# Patient Record
Sex: Female | Born: 1935 | Race: White | Hispanic: No | Smoking: Never smoker
Health system: Southern US, Community
[De-identification: ages and names within clinical notes are randomized; demographics above are authoritative.]

## PROBLEM LIST (undated history)

## (undated) DIAGNOSIS — R0789 Other chest pain: Secondary | ICD-10-CM

## (undated) DIAGNOSIS — I10 Essential (primary) hypertension: Secondary | ICD-10-CM

## (undated) DIAGNOSIS — I472 Ventricular tachycardia, unspecified: Secondary | ICD-10-CM

## (undated) DIAGNOSIS — H269 Unspecified cataract: Secondary | ICD-10-CM

## (undated) DIAGNOSIS — I480 Paroxysmal atrial fibrillation: Secondary | ICD-10-CM

## (undated) DIAGNOSIS — M1990 Unspecified osteoarthritis, unspecified site: Secondary | ICD-10-CM

## (undated) DIAGNOSIS — H3530 Unspecified macular degeneration: Secondary | ICD-10-CM

## (undated) DIAGNOSIS — A159 Respiratory tuberculosis unspecified: Secondary | ICD-10-CM

## (undated) DIAGNOSIS — R300 Dysuria: Secondary | ICD-10-CM

## (undated) DIAGNOSIS — Z7901 Long term (current) use of anticoagulants: Secondary | ICD-10-CM

## (undated) DIAGNOSIS — K219 Gastro-esophageal reflux disease without esophagitis: Secondary | ICD-10-CM

## (undated) DIAGNOSIS — S32010A Wedge compression fracture of first lumbar vertebra, initial encounter for closed fracture: Secondary | ICD-10-CM

## (undated) HISTORY — DX: Unspecified osteoarthritis, unspecified site: M19.90

## (undated) HISTORY — DX: Ventricular tachycardia: I47.2

## (undated) HISTORY — DX: Unspecified macular degeneration: H35.30

## (undated) HISTORY — PX: APPENDECTOMY: SHX54

## (undated) HISTORY — DX: Respiratory tuberculosis unspecified: A15.9

## (undated) HISTORY — DX: Wedge compression fracture of first lumbar vertebra, initial encounter for closed fracture: S32.010A

## (undated) HISTORY — DX: Paroxysmal atrial fibrillation: I48.0

## (undated) HISTORY — DX: Other chest pain: R07.89

## (undated) HISTORY — PX: ABDOMINAL HYSTERECTOMY: SHX81

## (undated) HISTORY — DX: Ventricular tachycardia, unspecified: I47.20

## (undated) HISTORY — DX: Gastro-esophageal reflux disease without esophagitis: K21.9

## (undated) HISTORY — DX: Dysuria: R30.0

## (undated) HISTORY — DX: Long term (current) use of anticoagulants: Z79.01

## (undated) HISTORY — PX: BREAST BIOPSY: SHX20

## (undated) HISTORY — DX: Essential (primary) hypertension: I10

## (undated) HISTORY — DX: Unspecified cataract: H26.9

---

## 1998-08-04 ENCOUNTER — Ambulatory Visit (HOSPITAL_COMMUNITY): Admission: RE | Admit: 1998-08-04 | Discharge: 1998-08-04 | Payer: Self-pay | Admitting: Specialist

## 1999-05-10 ENCOUNTER — Encounter: Payer: Self-pay | Admitting: Cardiology

## 1999-05-10 ENCOUNTER — Encounter: Payer: Self-pay | Admitting: Emergency Medicine

## 1999-05-10 ENCOUNTER — Inpatient Hospital Stay (HOSPITAL_COMMUNITY): Admission: EM | Admit: 1999-05-10 | Discharge: 1999-05-14 | Payer: Self-pay | Admitting: Emergency Medicine

## 1999-05-11 ENCOUNTER — Encounter: Payer: Self-pay | Admitting: Cardiovascular Disease

## 1999-06-29 ENCOUNTER — Ambulatory Visit (HOSPITAL_COMMUNITY): Admission: RE | Admit: 1999-06-29 | Discharge: 1999-06-29 | Payer: Self-pay | Admitting: Specialist

## 1999-12-19 ENCOUNTER — Inpatient Hospital Stay (HOSPITAL_COMMUNITY): Admission: EM | Admit: 1999-12-19 | Discharge: 1999-12-20 | Payer: Self-pay | Admitting: *Deleted

## 1999-12-21 ENCOUNTER — Inpatient Hospital Stay (HOSPITAL_COMMUNITY): Admission: EM | Admit: 1999-12-21 | Discharge: 1999-12-29 | Payer: Self-pay | Admitting: Emergency Medicine

## 1999-12-21 ENCOUNTER — Encounter: Payer: Self-pay | Admitting: Emergency Medicine

## 1999-12-22 ENCOUNTER — Encounter: Payer: Self-pay | Admitting: Gastroenterology

## 1999-12-24 ENCOUNTER — Encounter: Payer: Self-pay | Admitting: Internal Medicine

## 2000-01-12 ENCOUNTER — Encounter: Admission: RE | Admit: 2000-01-12 | Discharge: 2000-01-12 | Payer: Self-pay | Admitting: Surgery

## 2000-01-12 ENCOUNTER — Encounter: Payer: Self-pay | Admitting: Surgery

## 2001-03-07 ENCOUNTER — Encounter: Admission: RE | Admit: 2001-03-07 | Discharge: 2001-03-07 | Payer: Self-pay | Admitting: Surgery

## 2001-03-07 ENCOUNTER — Encounter: Payer: Self-pay | Admitting: Surgery

## 2002-02-12 ENCOUNTER — Observation Stay (HOSPITAL_COMMUNITY): Admission: EM | Admit: 2002-02-12 | Discharge: 2002-02-13 | Payer: Self-pay | Admitting: Emergency Medicine

## 2002-02-12 ENCOUNTER — Encounter: Payer: Self-pay | Admitting: Emergency Medicine

## 2002-02-13 ENCOUNTER — Encounter: Payer: Self-pay | Admitting: Cardiovascular Disease

## 2002-03-22 ENCOUNTER — Encounter: Admission: RE | Admit: 2002-03-22 | Discharge: 2002-03-22 | Payer: Self-pay | Admitting: Surgery

## 2002-03-22 ENCOUNTER — Encounter: Payer: Self-pay | Admitting: Surgery

## 2003-08-02 ENCOUNTER — Emergency Department (HOSPITAL_COMMUNITY): Admission: EM | Admit: 2003-08-02 | Discharge: 2003-08-03 | Payer: Self-pay | Admitting: Emergency Medicine

## 2003-08-05 ENCOUNTER — Observation Stay (HOSPITAL_COMMUNITY): Admission: EM | Admit: 2003-08-05 | Discharge: 2003-08-15 | Payer: Self-pay

## 2003-10-16 ENCOUNTER — Ambulatory Visit (HOSPITAL_COMMUNITY): Admission: RE | Admit: 2003-10-16 | Discharge: 2003-10-17 | Payer: Self-pay | Admitting: Orthopaedic Surgery

## 2006-05-31 ENCOUNTER — Encounter: Admission: RE | Admit: 2006-05-31 | Discharge: 2006-05-31 | Payer: Self-pay | Admitting: Orthopedic Surgery

## 2006-06-01 ENCOUNTER — Ambulatory Visit (HOSPITAL_BASED_OUTPATIENT_CLINIC_OR_DEPARTMENT_OTHER): Admission: RE | Admit: 2006-06-01 | Discharge: 2006-06-01 | Payer: Self-pay | Admitting: Orthopedic Surgery

## 2006-07-14 ENCOUNTER — Ambulatory Visit (HOSPITAL_COMMUNITY): Admission: RE | Admit: 2006-07-14 | Discharge: 2006-07-14 | Payer: Self-pay | Admitting: Ophthalmology

## 2010-08-21 ENCOUNTER — Emergency Department (HOSPITAL_COMMUNITY): Payer: Medicare Other

## 2010-08-21 ENCOUNTER — Inpatient Hospital Stay (HOSPITAL_COMMUNITY)
Admission: EM | Admit: 2010-08-21 | Discharge: 2010-08-25 | DRG: 309 | Disposition: A | Discharge status: Home / Self Care | Payer: Medicare Other | Attending: Cardiovascular Disease | Admitting: Cardiovascular Disease

## 2010-08-21 DIAGNOSIS — K219 Gastro-esophageal reflux disease without esophagitis: Secondary | ICD-10-CM | POA: Diagnosis present

## 2010-08-21 DIAGNOSIS — I4891 Unspecified atrial fibrillation: Principal | ICD-10-CM | POA: Diagnosis present

## 2010-08-21 DIAGNOSIS — R109 Unspecified abdominal pain: Secondary | ICD-10-CM

## 2010-08-21 DIAGNOSIS — H3530 Unspecified macular degeneration: Secondary | ICD-10-CM | POA: Diagnosis present

## 2010-08-21 DIAGNOSIS — I509 Heart failure, unspecified: Secondary | ICD-10-CM | POA: Diagnosis present

## 2010-08-21 DIAGNOSIS — M1990 Unspecified osteoarthritis, unspecified site: Secondary | ICD-10-CM | POA: Diagnosis present

## 2010-08-21 DIAGNOSIS — E860 Dehydration: Secondary | ICD-10-CM | POA: Diagnosis present

## 2010-08-21 DIAGNOSIS — R002 Palpitations: Secondary | ICD-10-CM

## 2010-08-21 DIAGNOSIS — I10 Essential (primary) hypertension: Secondary | ICD-10-CM | POA: Diagnosis present

## 2010-08-21 DIAGNOSIS — Z7982 Long term (current) use of aspirin: Secondary | ICD-10-CM

## 2010-08-21 DIAGNOSIS — Z8611 Personal history of tuberculosis: Secondary | ICD-10-CM

## 2010-08-21 DIAGNOSIS — Z79899 Other long term (current) drug therapy: Secondary | ICD-10-CM

## 2010-08-21 DIAGNOSIS — I5030 Unspecified diastolic (congestive) heart failure: Secondary | ICD-10-CM | POA: Diagnosis present

## 2010-08-21 HISTORY — PX: OTHER SURGICAL HISTORY: SHX169

## 2010-08-21 LAB — CBC
HCT: 34.2 % — ABNORMAL LOW (ref 36.0–46.0)
MCHC: 32.2 g/dL (ref 30.0–36.0)
Platelets: 273 10*3/uL (ref 150–400)
RBC: 4 MIL/uL (ref 3.87–5.11)
RDW: 14.6 % (ref 11.5–15.5)

## 2010-08-21 LAB — POCT CARDIAC MARKERS
CKMB, poc: 2.8 ng/mL (ref 1.0–8.0)
Troponin i, poc: <0.05 ng/mL (ref 0.00–0.09)

## 2010-08-21 LAB — DIFFERENTIAL
Eosinophils Absolute: 0.1 10*3/uL (ref 0.0–0.7)
Lymphocytes Relative: 21 % (ref 12–46)
Neutro Abs: 5.4 10*3/uL (ref 1.7–7.7)
Neutrophils Relative %: 67 % (ref 43–77)

## 2010-08-21 LAB — COMPREHENSIVE METABOLIC PANEL
ALT: 15 U/L (ref 0–35)
Alkaline Phosphatase: 90 U/L (ref 39–117)
CO2: 22 mEq/L (ref 19–32)
Calcium: 9.1 mg/dL (ref 8.4–10.5)
Chloride: 98 mEq/L (ref 96–112)
GFR calc Af Amer: >60 mL/min (ref >60)
Glucose, Bld: 125 mg/dL — ABNORMAL HIGH (ref 70–99)
Potassium: 3.4 mEq/L — ABNORMAL LOW (ref 3.5–5.1)

## 2010-08-21 LAB — PROTIME-INR: INR: 0.91 (ref 0.00–1.49)

## 2010-08-21 LAB — PRO B NATRIURETIC PEPTIDE: Pro B Natriuretic peptide (BNP): 308.6 pg/mL (ref 0–450)

## 2010-08-21 LAB — MAGNESIUM: Magnesium: 2.2 mg/dL (ref 1.5–2.5)

## 2010-08-21 LAB — TROPONIN I: Troponin I: 0.36 ng/mL (ref <0.30)

## 2010-08-22 DIAGNOSIS — I4891 Unspecified atrial fibrillation: Secondary | ICD-10-CM

## 2010-08-22 LAB — CARDIAC PANEL(CRET KIN+CKTOT+MB+TROPI)
CK, MB: 5.7 ng/mL — ABNORMAL HIGH (ref 0.3–4.0)
CK, MB: 4.6 ng/mL — ABNORMAL HIGH (ref 0.3–4.0)
Relative Index: INVALID (ref 0.0–2.5)
Total CK: 100 U/L (ref 7–177)
Troponin I: 0.5 ng/mL (ref <0.30)
Troponin I: <0.3 ng/mL (ref <0.30)

## 2010-08-22 LAB — URINE MICROSCOPIC-ADD ON

## 2010-08-22 LAB — URINALYSIS, ROUTINE W REFLEX MICROSCOPIC
Bilirubin Urine: NEGATIVE
Glucose, UA: NEGATIVE mg/dL
Hgb urine dipstick: NEGATIVE
Ketones, ur: NEGATIVE mg/dL
Nitrite: NEGATIVE
Protein, ur: NEGATIVE mg/dL
Specific Gravity, Urine: 1.01 (ref 1.005–1.030)
Urobilinogen, UA: 0.2 mg/dL (ref 0.0–1.0)
pH: 6 (ref 5.0–8.0)

## 2010-08-22 LAB — HEPARIN LEVEL (UNFRACTIONATED): Heparin Unfractionated: 0.18 IU/mL — ABNORMAL LOW (ref 0.30–0.70)

## 2010-08-22 NOTE — H&P (Signed)
NAME:  Kristy Valencia, KRUSCHKE NO.:  [id number]  MEDICAL RECORD NO.:  [id number]           PATIENT TYPE:  E  LOCATION:  [facility]                         FACILITY:  [facility]  PHYSICIAN:  Zacarias Pontes, MD       DATE OF BIRTH:  13-Oct-1919  DATE OF ADMISSION:  08/21/2010 DATE OF DISCHARGE:                             HISTORY & PHYSICAL   PRIMARY CARDIOLOGIST:  Vesta Mixer, M.D.  LOCATION:  Assessed in emergency room.  CHIEF COMPLAINT:  Palpitations and lower abdominal pain.  HISTORY OF PRESENT ILLNESS:  [name] is a pleasant 77 year old woman with a remote history of paroxysmal atrial fibrillation who presents with lower abdominal pain and urinary urgency.  In my interview of the patient who is currently unaccompanied by any family, she says her main complaint from earlier today is lower abdominal pain, dysuria, and urinary urgency.  She also reports having had difficulty voiding over the course of today.  She currently denies chest pain or palpitations, though her ability to reliably give accurate history appears to be somewhat limited.  By report, given the emergency room staffs' discussions with the patient's family, it seems that she reportedly experienced chest tightness and palpitations earlier in the day prompting a decision to seek medical care and further evaluation.  Of note, the patient denies fevers, nausea, or diarrhea.  She reports that her appetite has been somewhat diminished over the course of the past several days.  PAST MEDICAL HISTORY: 1. Paroxysmal atrial fibrillation in the remote past requiring     cardioversion.  It seems she was previously on systemic     anticoagulation in the form of Coumadin, though not currently. 2. Remote TB. 3. Osteoarthritis. 4. GERD. 5. Hypertension. 6. L1 compression fracture. 7. Macular degeneration.  SOCIAL HISTORY:  She is widowed and retired and reportedly lives with family though is currently unaccompanied  by any family.  She is a nonsmoker.  FAMILY HISTORY:  Noncontributory to this presentation.  REVIEW OF SYSTEMS:  As per HPI, otherwise is comprehensively negative.  ALLERGIES:  PENICILLIN, CODEINE, SULFA, and PROCARDIA.  MEDICATIONS:  By report, the patient takes aspirin and Lasix on a daily basis.  When asked directly, she is unable to recall what medicines she takes.  PHYSICAL EXAMINATION:  Comprehensive cardiovascular exam was performed. VITAL SIGNS:  On my assessment, the patient's heart rate is in the 150s to 160s and irregular, though narrow complex.  Her blood pressure is 120/70.  She is breathing comfortably at 16 times per minute and satting 98% on 2 liters of nasal cannula. GENERAL:  She is in no acute distress. HEENT:  Notable for a supple neck with no masses or lymphadenopathy. Her JVP is not appreciably elevated. CARDIAC:  Notable for an irregular rapid heart rate with no murmurs appreciated. LUNGS:  Clear to auscultation bilaterally with no wheezing or crackles appreciated. ABDOMEN:  Soft and nondistended.  She has lower abdominal tenderness in the suprapubic region.  No rebound or guarding is present. EXTREMITIES:  Warm and well perfused with 1+ lower extremity pitting edema bilaterally. NEUROLOGIC:  She is alert and oriented to  person and place.  She has difficulty recalling the details earlier today and provides various versions of who it is that she actually lives with.  LABORATORY EVALUATION:  Her basic metabolic panel is notable for a sodium of 132, potassium 3.4, bicarb 22, BUN 10, creatinine 0.8, glucose 125.  Her white count is 4, her hematocrit is 34, her platelets are 273,000.  Her extended panel is unremarkable.  Her pro-BNP is in the normal range at 308.  Her first set of troponins were negative.  Her INR is 0.9 reflecting the absence of systemic anticoagulation.  Her ECG on presentation notable for a narrow complex tachycardia that is  irregular consistent with AFib with a rapid ventricular response with rates in the 170s to 180s on presentation.  A cardiac catheterization in 2001 was reportedly negative.  A nuclear perfusion stress test in 2003 was negative in our system.  IMPRESSION:  This is an 77 year old woman presenting with atrial fibrillation with a rapid ventricular response in the setting of a possible urinary tract infection and lower abdominal discomfort together with mild dehydration.  The keys to her AFib treatment will lie in addressing any underlying or reversible triggers for her rapid ventricular rate.  Based on her current complaints and her exam, a diagnostic focus is in the direction of potential urinary tract infection.  PLANS:  We will admit her for rate control with intravenous diltiazem infusion for now, but I will work to reverse underlying causes that potentially are driving her RVR.  We will send a urinalysis, urine culture, as well as place a Foley catheter to decompress her bladder given her historical symptoms consistent with potentially urinary retention.  Given what appears to be her relative dehydration, we will initiate IV fluids gently in the form of bolus followed by maintenance fluids.  She does not appear toxic nor does she have leukocytosis, nor does she have a fever, so I will dose her antibiotics only if her urinalysis is preliminarily suggestive.  Should her heart rate remain elevated despite fluids and intravenous diltiazem as well as potentially administering antibiotic should her urinalysis prove suggestive, then intravenous amiodarone might provide help in addressing her RPR.  While she is in atrial fibrillation, we will initiate intravenous heparin for thromboembolic protection, though I do not feel that she needs a heparin bolus.  An echocardiogram in the morning will help confirm that she does not have underlying structural heart disease.  There is nothing on her  exam or on her history that would suggest to me that she has structural heart disease.  It will be very helpful to corroborate the patient's history with available family in the morning.          ______________________________ Zacarias Pontes, MD     DM/MEDQ  D:  08/21/2010  T:  08/22/2010  Job:  [id number]  Electronically Signed by Zacarias Pontes MD on 08/22/2010 07:19:32 AM

## 2010-08-23 LAB — CBC
HCT: 32.5 % — ABNORMAL LOW (ref 36.0–46.0)
MCH: 27.6 pg (ref 26.0–34.0)
MCHC: 32 g/dL (ref 30.0–36.0)
MCV: 86.2 fL (ref 78.0–100.0)
Platelets: 256 10*3/uL (ref 150–400)
RDW: 14.8 % (ref 11.5–15.5)
WBC: 6.7 10*3/uL (ref 4.0–10.5)

## 2010-08-23 LAB — URINE CULTURE: Culture  Setup Time: 201205190622

## 2010-08-23 LAB — BASIC METABOLIC PANEL
BUN: 9 mg/dL (ref 6–23)
CO2: 24 mEq/L (ref 19–32)
Calcium: 9 mg/dL (ref 8.4–10.5)
GFR calc non Af Amer: >60 mL/min (ref >60)
Glucose, Bld: 90 mg/dL (ref 70–99)
Sodium: 138 mEq/L (ref 135–145)

## 2010-08-24 DIAGNOSIS — I369 Nonrheumatic tricuspid valve disorder, unspecified: Secondary | ICD-10-CM

## 2010-08-24 HISTORY — PX: TRANSTHORACIC ECHOCARDIOGRAM: SHX275

## 2010-08-24 LAB — URINALYSIS, ROUTINE W REFLEX MICROSCOPIC
Bilirubin Urine: NEGATIVE
Ketones, ur: NEGATIVE mg/dL
Nitrite: NEGATIVE
Specific Gravity, Urine: 1.004 — ABNORMAL LOW (ref 1.005–1.030)
Urobilinogen, UA: 0.2 mg/dL (ref 0.0–1.0)

## 2010-08-24 LAB — GLUCOSE, CAPILLARY
Glucose-Capillary: 93 mg/dL (ref 70–99)
Glucose-Capillary: 105 mg/dL — ABNORMAL HIGH (ref 70–99)

## 2010-08-24 LAB — CBC
HCT: 32.9 % — ABNORMAL LOW (ref 36.0–46.0)
Platelets: 276 10*3/uL (ref 150–400)
RBC: 3.84 MIL/uL — ABNORMAL LOW (ref 3.87–5.11)
RDW: 14.9 % (ref 11.5–15.5)
WBC: 7.4 10*3/uL (ref 4.0–10.5)

## 2010-08-25 LAB — CBC
MCH: 27.8 pg (ref 26.0–34.0)
MCHC: 32.3 g/dL (ref 30.0–36.0)
Platelets: 290 10*3/uL (ref 150–400)

## 2010-08-26 ENCOUNTER — Telehealth: Payer: Self-pay | Admitting: *Deleted

## 2010-08-26 NOTE — Telephone Encounter (Signed)
I received a request from Christy,pharmacist at [facility] [address] to obtain prior authorization for Xarelto 10mg  two tablets daily. According to [facility] the prescription was written 08/25/10  by Leonette Monarch with Dr Alford Highland name circled on the prescription.   The patient has an appointment to see Norma Fredrickson, nurse practitioner for Dr Melburn Popper June 11,2012. The discharge summary dated 08/25/10  states that after discussion with the patient's daughter and Dr Melburn Popper it was decided the patient would be started on Xarelto. The discharge summary also states this  was initiated on May 21,2012, the patient tolerated this well and this will be continued at discharge.  The discharge summary dated 08/25/10 also states on the day of discharge Dr Melburn Popper evaluated the patient and noted her stable for home.   I was told by Neysa Bonito at [facility] that [facility] at  Dr Sallee Provencal office would not obtain prior authorization for Xarelto. Neysa Bonito also told me that the patient was due to take Xarelto now and did not have any  medication because authorization had not been obtained.  I obtained  authorization for Xarelto through [facility] at [employer] [phone] reference # [id number].

## 2010-09-04 NOTE — Discharge Summary (Signed)
NAME:  Kristy Valencia, Kristy Valencia NO.:  [id number]  MEDICAL RECORD NO.:  [id number]           PATIENT TYPE:  I  LOCATION:  3707                         FACILITY:  [facility]  PHYSICIAN:  Vesta Mixer, M.D. DATE OF BIRTH:  July 18, 1919  DATE OF ADMISSION:  08/21/2010 DATE OF DISCHARGE:  08/25/2010                              DISCHARGE SUMMARY   PRIMARY CARDIOLOGIST:  Vesta Mixer, MD  DISCHARGE DIAGNOSIS: 1. Paroxysmal atrial fibrillation, Xarelto started this admission.     a.     Currently in normal sinus rhythm.  SECONDARY DIAGNOSES: 1. Hypertension. 2. Osteoarthritis. 3. Gastroesophageal reflux disease. 4. Remote TB. 5. Osteoarthritis. 6. Macular degeneration.  PROCEDURE//DIAGNOSTICS PERFORMED DURING HOSPITALIZATION: 1. Echocardiogram on Aug 24, 2010:  Left ventricle with wall thickness     demonstrating moderate LVH.  Systolic function was decreased with     an estimated ejection fraction of 65-70%.  Pulmonary artery peak     pressure of 34 mmHg. 2. Chest x-ray on Aug 21, 2010:  Cardiomegaly with mild edema.  ALLERGIES: 1. PENICILLIN. 2. CODEINE. 3. SULFA. 4. PROCARDIA.  REASON FOR HOSPITALIZATION:  This is an 77 year old female with the above-stated problem list.  The patient was brought to the emergency department by her family with reports of experiencing chest tightness and palpitations.  During evaluation, the patient denied any palpitations, but with lower abdominal pain.  The patient's initial EKG showed atrial fibrillation with rapid ventricular response at a rate of 170-180 beats per minute.  The patient was placed on intravenous diltiazem as well as IV heparin and admitted for further evaluation.  The patient was admitted to the Step-Down Unit.  On IV diltiazem, the patient spontaneously converted to sinus rhythm on Aug 22, 2010.  It appears the patient was taken off her Coumadin approximately a year ago secondary to erratic INR as well as  the patient's confusion with dose, this is per the patient's daughter.  With the patient's CHADS2 score of approximately 2-3, further consideration for anticoagulation was discussed.  After discussion with the patient's daughter and Dr. Elease Hashimoto it was decided the patient will be started on Xarelto.  This was initiated on Aug 24, 2010, the patient tolerated this well and this will be continued at discharge.  The patient was then transitioned from IV diltiazem to oral diltiazem.  The patient had several episodes of nonsustained atrial fibrillation off diltiazem drip.  With the patient's baseline heart rate being slow, Dr. Elease Hashimoto changed the patient's diltiazem to metoprolol 25 mg twice daily.  She tolerated this well.  The patient had no further complaints of chest pain during admission. Her troponin was mildly elevated with a peak of 0.5, this is felt to be secondary to the patient's rapid ventricular response and nonacute coronary syndrome.  A 2-D echocardiogram was obtained for further evaluation.  This showed no wall motion abnormalities.  The left ventricle had an estimated ejection fraction of 65-70%.  No further cardiac workup was needed at this time.  With the patient's lower abdominal pain, a urinalysis was ordered, this came back with no growth, the patient was not initiated on  antibiotic therapy.  On the day of discharge, Dr. Elease Hashimoto evaluated the patient and noted her stable for home.  She had no complaints.  The patient remained in sinus rhythm.  Discharge plans and instructions were discussed with the patient, she voiced understanding.  DISCHARGE LABORATORY DATA:  WBC 7.1, hemoglobin 10.4, hematocrit 32.2, and platelets 219.  DISCHARGE MEDICATIONS: 1. Lopressor 25 mg 1 tablet twice daily. 2. Potassium chloride 10 mEq 1 tablet daily. 3. Rivaroxaban 10 mg 2 tablets daily. 4. Artificial tears 1 drop each eye daily. 5. Furosemide 20 mg 1 tablet daily. 6. Multivitamin over  the counter 1 tablet daily. 7. Please stop taking aspirin.  FOLLOWUP PLANS AND INSTRUCTIONS: 1. The patient will follow up with Norma Fredrickson, nurse practitioner     for Dr. Elease Hashimoto on September 14, 2010, at 2:15. 2. The patient should increase activity as tolerated. 3. The patient should continue a low-sodium, heart-healthy diet. 4. The patient should call the office in the interim for any problems     or concerns.  DURATION OF DISCHARGE:  Greater than 30 minutes with physician and physician extender time.     Leonette Monarch, PA-C   ______________________________ Vesta Mixer, M.D.    NB/MEDQ  D:  08/25/2010  T:  08/26/2010  Job:  [id number]  Electronically Signed by Alen Blew P.A. on 09/01/2010 10:24:28 AM Electronically Signed by Kristeen Miss M.D. on 09/04/2010 04:48:03 PM

## 2010-09-14 ENCOUNTER — Ambulatory Visit: Payer: Medicare Other | Admitting: Nurse Practitioner

## 2010-09-15 ENCOUNTER — Encounter: Payer: Self-pay | Admitting: Nurse Practitioner

## 2010-09-18 ENCOUNTER — Ambulatory Visit (INDEPENDENT_AMBULATORY_CARE_PROVIDER_SITE_OTHER): Payer: Medicare Other | Admitting: Nurse Practitioner

## 2010-09-18 ENCOUNTER — Encounter: Payer: Self-pay | Admitting: Nurse Practitioner

## 2010-09-18 DIAGNOSIS — I4891 Unspecified atrial fibrillation: Secondary | ICD-10-CM

## 2010-09-18 DIAGNOSIS — R300 Dysuria: Secondary | ICD-10-CM

## 2010-09-18 DIAGNOSIS — I10 Essential (primary) hypertension: Secondary | ICD-10-CM | POA: Insufficient documentation

## 2010-09-18 DIAGNOSIS — I480 Paroxysmal atrial fibrillation: Secondary | ICD-10-CM | POA: Insufficient documentation

## 2010-09-18 DIAGNOSIS — Z7901 Long term (current) use of anticoagulants: Secondary | ICD-10-CM

## 2010-09-18 LAB — CBC WITH DIFFERENTIAL/PLATELET
Basophils Absolute: 0 10*3/uL (ref 0.0–0.1)
Basophils Relative: 0.4 % (ref 0.0–3.0)
Eosinophils Absolute: 0 10*3/uL (ref 0.0–0.7)
Eosinophils Relative: 0.3 % (ref 0.0–5.0)
HCT: 32.8 % — ABNORMAL LOW (ref 36.0–46.0)
Hemoglobin: 11.1 g/dL — ABNORMAL LOW (ref 12.0–15.0)
Lymphocytes Relative: 22.2 % (ref 12.0–46.0)
Lymphs Abs: 1.2 10*3/uL (ref 0.7–4.0)
MCHC: 33.7 g/dL (ref 30.0–36.0)
MCV: 86.3 fl (ref 78.0–100.0)
Monocytes Absolute: 0.6 10*3/uL (ref 0.1–1.0)
Monocytes Relative: 10 % (ref 3.0–12.0)
Neutro Abs: 3.7 10*3/uL (ref 1.4–7.7)
Neutrophils Relative %: 67.1 % (ref 43.0–77.0)
Platelets: 282 10*3/uL (ref 150.0–400.0)
RBC: 3.8 Mil/uL — ABNORMAL LOW (ref 3.87–5.11)
RDW: 15.3 % — ABNORMAL HIGH (ref 11.5–14.6)
WBC: 5.6 10*3/uL (ref 4.5–10.5)

## 2010-09-18 LAB — URINALYSIS, ROUTINE W REFLEX MICROSCOPIC
Bilirubin Urine: NEGATIVE
Hgb urine dipstick: NEGATIVE
Ketones, ur: NEGATIVE
Nitrite: NEGATIVE
Specific Gravity, Urine: <=1.005 (ref 1.000–1.030)
Total Protein, Urine: NEGATIVE
Urine Glucose: NEGATIVE
Urobilinogen, UA: 0.2 (ref 0.0–1.0)
pH: 6 (ref 5.0–8.0)

## 2010-09-18 LAB — BASIC METABOLIC PANEL
BUN: 18 mg/dL (ref 6–23)
CO2: 28 mEq/L (ref 19–32)
Calcium: 9.6 mg/dL (ref 8.4–10.5)
Chloride: 102 mEq/L (ref 96–112)
Creatinine, Ser: 0.7 mg/dL (ref 0.4–1.2)
GFR: 86.5 mL/min (ref >60.00)
Glucose, Bld: 77 mg/dL (ref 70–99)
Potassium: 4.4 mEq/L (ref 3.5–5.1)
Sodium: 136 mEq/L (ref 135–145)

## 2010-09-18 NOTE — Assessment & Plan Note (Signed)
She is on full dose. We will check her renal function dose. She may be able to just use 10 mg satisfactorily. No bleeding or excessive bruising is reported.

## 2010-09-18 NOTE — Patient Instructions (Signed)
We are going to check some labs and a urinalysis today. We will see you back in about 3 months. Call for any problems.

## 2010-09-18 NOTE — Assessment & Plan Note (Signed)
She is doing well with her current regimen. She is on Xarelto for anticoagulation.

## 2010-09-18 NOTE — Assessment & Plan Note (Signed)
UA is checked today.

## 2010-09-18 NOTE — Assessment & Plan Note (Signed)
Blood pressure is ok. We will keep her on her current regimen.

## 2010-09-18 NOTE — Progress Notes (Signed)
Patient called with lab results. Pt verbalized understanding. Jodette Evangela Heffler RN  

## 2010-09-18 NOTE — Progress Notes (Signed)
Kristy Valencia Date of Birth: 10-10-1919   History of Present Illness: Kristy Valencia is seen back today for a post hospital visit. She is seen for Dr. Elease Hashimoto. She is here with her daughter. Overall, she is doing well. She had been admitted last month with atrial fib. Echo showed vigorous EF at 65 to 70% with moderate LVH. She is now on Xarelto. She had too much lability in her INRs when on Coumadin in the past. She is doing ok. No palpitations. She is tolerating her medicines. She does note some urinary frequency and we will check a UA today. No chest pain. No shortness of breath. No falls. She is using her cane.  She has mild bruising. She is on full dose Xarelto.   Current Outpatient Prescriptions on File Prior to Visit  Medication Sig Dispense Refill  . furosemide (LASIX) 20 MG tablet Take 20 mg by mouth daily.        . hydroxypropyl methylcellulose (ISOPTO TEARS) 2.5 % ophthalmic solution Place 1 drop into both eyes daily.        . metoprolol tartrate (LOPRESSOR) 25 MG tablet Take 25 mg by mouth 2 (two) times daily.        . Multiple Vitamin (MULTIVITAMIN) tablet Take 1 tablet by mouth daily.        . potassium chloride (KLOR-CON) 10 MEQ CR tablet Take 10 mEq by mouth daily.        . rivaroxaban (XARELTO) 10 MG TABS tablet Take 20 mg by mouth daily.          Allergies  Allergen Reactions  . Codeine   . Penicillins   . Procardia (Nifedipine)   . Sulfa Drugs Cross Reactors     Past Medical History  Diagnosis Date  . Palpitations   . Abdominal pain     LOWER  . PAF (paroxysmal atrial fibrillation)   . Dysuria   . TB (tuberculosis)   . OA (osteoarthritis)   . GERD (gastroesophageal reflux disease)   . Hypertension   . Macular degeneration   . Compression fracture of L1 lumbar vertebra   . Chest tightness   . VT (ventricular tachycardia)   . Chronic anticoagulation     Xarelto    Past Surgical History  Procedure Date  . Transthoracic echocardiogram 08/24/10    Left  ventricle with wall thickness demonstrating moderate LVH.  Systolic function was decreased with   an estimated ejection fraction of 65-70%.  Pulmonary artery peak  pressure of 34 mmHg  . Chest x-ray 08/21/2010    Cardiomegaly with mild edema  . Breast biopsy     X2  . Appendectomy     History  Smoking status  . Never Smoker   Smokeless tobacco  . Not on file    History  Alcohol Use No    Family History  Problem Relation Age of Onset  . Stomach cancer Father     Review of Systems: The review of systems is positive for urinary frequency and arthritis.  All other systems were reviewed and are negative.  Physical Exam: BP 122/60  Pulse 68  Ht 5' 7.5" (1.715 m)  Wt 168 lb 6.4 oz (76.386 kg)  BMI 25.99 kg/m2 Patient is very pleasant and in no acute distress.She looks younger than her stated age. Skin is warm and dry. Color is normal.  HEENT is unremarkable. Normocephalic/atraumatic. PERRL. Sclera are nonicteric. Neck is supple. No masses. No JVD. Lungs are clear. Cardiac exam  shows a regular rate and rhythm. Abdomen is soft. Extremities are full with mild edema. Gait and ROM are intact. She is using a cane.  No gross neurologic deficits noted.  LABORATORY DATA: Pending   Assessment / Plan:

## 2010-09-21 ENCOUNTER — Telehealth: Payer: Self-pay | Admitting: Cardiovascular Disease

## 2010-09-21 NOTE — Telephone Encounter (Signed)
Pt's daughter called wanted to know about mothers labs she had done on Fri

## 2010-12-21 ENCOUNTER — Encounter: Payer: Self-pay | Admitting: Cardiovascular Disease

## 2010-12-21 ENCOUNTER — Ambulatory Visit (INDEPENDENT_AMBULATORY_CARE_PROVIDER_SITE_OTHER): Payer: Medicare Other | Admitting: Cardiovascular Disease

## 2010-12-21 VITALS — BP 120/76 | HR 64 | Ht 66.5 in | Wt 173.6 lb

## 2010-12-21 DIAGNOSIS — I480 Paroxysmal atrial fibrillation: Secondary | ICD-10-CM

## 2010-12-21 DIAGNOSIS — Z7901 Long term (current) use of anticoagulants: Secondary | ICD-10-CM

## 2010-12-21 DIAGNOSIS — I4891 Unspecified atrial fibrillation: Secondary | ICD-10-CM

## 2010-12-21 NOTE — Assessment & Plan Note (Signed)
We'll continue on the current dose of Xarelto. She has not had any bleeding.

## 2010-12-21 NOTE — Progress Notes (Signed)
Kristy Valencia Date of Birth  07-31-1919  [facility] [address]     [address] [place], [place]  [address] [phone]  Fax  [phone]  History of Present Illness:  Kristy Valencia is an 77 year old female who was recently admitted to the hospital with atrial fibrillation associated with the urinary tract infection.   She converted to normal sinus rhythm on IV diltiazem. She's done very well since that point.   She's doing quite well at this point. She has not had any episodes of chest pain or shortness breath, syncope, or presyncope.  Current Outpatient Prescriptions  Medication Sig Dispense Refill  . Docusate Calcium (STOOL SOFTENER PO) Take by mouth. Twice a Week       . furosemide (LASIX) 20 MG tablet Take 20 mg by mouth daily.        . metoprolol tartrate (LOPRESSOR) 25 MG tablet Take 25 mg by mouth 2 (two) times daily.        . Multiple Vitamin (MULTIVITAMIN) tablet Take 1 tablet by mouth daily.        . Omega-3 Fatty Acids (FISH OIL PO) Take by mouth daily.        . potassium chloride (KLOR-CON) 10 MEQ CR tablet Take 10 mEq by mouth daily.        . Rivaroxaban (XARELTO) 20 MG TABS Take by mouth.           Allergies  Allergen Reactions  . Codeine   . Penicillins   . Procardia (Nifedipine)   . Sulfa Drugs Cross Reactors     Past Medical History  Diagnosis Date  . Palpitations   . Abdominal pain     LOWER  . PAF (paroxysmal atrial fibrillation)   . Dysuria   . TB (tuberculosis)   . OA (osteoarthritis)   . GERD (gastroesophageal reflux disease)   . Hypertension   . Macular degeneration   . Compression fracture of L1 lumbar vertebra   . Chest tightness   . VT (ventricular tachycardia)   . Chronic anticoagulation     Xarelto    Past Surgical History  Procedure Date  . Transthoracic echocardiogram 08/24/10    Left ventricle with wall thickness demonstrating moderate LVH.  Systolic function was decreased with   an estimated ejection fraction of 65-70%.   Pulmonary artery peak  pressure of 34 mmHg  . Chest x-ray 08/21/2010    Cardiomegaly with mild edema  . Breast biopsy     X2  . Appendectomy     History  Smoking status  . Never Smoker   Smokeless tobacco  . Not on file    History  Alcohol Use No    Family History  Problem Relation Age of Onset  . Stomach cancer Father     Reviw of Systems:  Reviewed in the HPI.  All other systems are negative.  Physical Exam: BP 120/76  Pulse 64  Ht 5' 6.5" (1.689 m)  Wt 173 lb 9.6 oz (78.744 kg)  BMI 27.60 kg/m2 The patient is alert and oriented x 3.  The mood and affect are normal.   Skin: warm and dry.  Color is normal.    HEENT:   the sclera are nonicteric.  The mucous membranes are moist.  The carotids are 2+ without bruits.  There is no thyromegaly.  There is no JVD.    Lungs: clear.  The chest wall is non tender.    Heart: regular rate with a normal  S1 and S2.  There are no murmurs, gallops, or rubs. The PMI is not displaced.     Abdomen: good bowel sounds.  There is no guarding or rebound.  There is no hepatosplenomegaly or tenderness.  There are no masses.   Extremities:  no clubbing, cyanosis, or edema.  The legs are without rashes.  The distal pulses are intact.   Neuro:  Cranial nerves II - XII are intact.  Motor and sensory functions are intact.    The gait is normal.  ECG:  Assessment / Plan:

## 2010-12-21 NOTE — Assessment & Plan Note (Signed)
Clinically she remains in normal sinus rhythm. We'll continue this throughout her. She has not had any episodes of bleeding.

## 2011-03-15 ENCOUNTER — Telehealth: Payer: Self-pay | Admitting: Cardiovascular Disease

## 2011-03-15 NOTE — Telephone Encounter (Signed)
She should come to coumadin clinic to get started on Coumadin.

## 2011-03-15 NOTE — Telephone Encounter (Signed)
I spoke with daughter, Wyvonnia Lora, this afternoon about cost of pt xarelto.  She says her mother is in the doughnut hole at this time and is due for refill of Xarelto this week. The medication would cost her now $600.00/month.  She is living with her mother now full time. States she would also be able to daily give her the coumadin. I will forward to Dr. Elease Hashimoto for review. Mylo Red RN

## 2011-03-15 NOTE — Telephone Encounter (Signed)
New msg Pt wants to switch from xarelto to less expensive medicine please call

## 2011-03-16 MED ORDER — WARFARIN SODIUM 2.5 MG PO TABS: 2.5000 mg | ORAL_TABLET | Freq: Every day | ORAL | Status: DC

## 2011-03-16 NOTE — Telephone Encounter (Signed)
Spoke with Weston Brass, pt to continue Xarelto and start coumadin 2.5 mg/sent to pharmacy, app Friday and will advise from there. Spoke with pts daughter and she verbalized understanding. Alfonso Ramus RN

## 2011-03-19 ENCOUNTER — Ambulatory Visit (INDEPENDENT_AMBULATORY_CARE_PROVIDER_SITE_OTHER): Payer: Medicare Other | Admitting: *Deleted

## 2011-03-19 ENCOUNTER — Other Ambulatory Visit: Payer: Self-pay | Admitting: *Deleted

## 2011-03-19 DIAGNOSIS — Z7901 Long term (current) use of anticoagulants: Secondary | ICD-10-CM

## 2011-03-19 DIAGNOSIS — I480 Paroxysmal atrial fibrillation: Secondary | ICD-10-CM

## 2011-03-19 DIAGNOSIS — I4891 Unspecified atrial fibrillation: Secondary | ICD-10-CM

## 2011-03-19 MED ORDER — METOPROLOL TARTRATE 25 MG PO TABS: 25.0000 mg | ORAL_TABLET | Freq: Two times a day (BID) | ORAL | Status: DC

## 2011-03-19 MED ORDER — POTASSIUM CHLORIDE 10 MEQ PO TBCR: 10.0000 meq | EXTENDED_RELEASE_TABLET | Freq: Every day | ORAL | Status: DC

## 2011-03-19 NOTE — Patient Instructions (Signed)

## 2011-03-26 ENCOUNTER — Encounter: Payer: Medicare Other | Admitting: *Deleted

## 2011-04-05 ENCOUNTER — Ambulatory Visit (INDEPENDENT_AMBULATORY_CARE_PROVIDER_SITE_OTHER): Payer: Medicare Other | Admitting: *Deleted

## 2011-04-05 DIAGNOSIS — I480 Paroxysmal atrial fibrillation: Secondary | ICD-10-CM

## 2011-04-05 DIAGNOSIS — Z7901 Long term (current) use of anticoagulants: Secondary | ICD-10-CM

## 2011-04-05 DIAGNOSIS — I4891 Unspecified atrial fibrillation: Secondary | ICD-10-CM

## 2011-04-05 LAB — POCT INR: INR: 2.2

## 2011-04-05 MED ORDER — WARFARIN SODIUM 2.5 MG PO TABS: 2.5000 mg | ORAL_TABLET | Freq: Every day | ORAL | Status: DC

## 2011-04-12 ENCOUNTER — Ambulatory Visit (INDEPENDENT_AMBULATORY_CARE_PROVIDER_SITE_OTHER): Payer: Medicare Other | Admitting: *Deleted

## 2011-04-12 DIAGNOSIS — I4891 Unspecified atrial fibrillation: Secondary | ICD-10-CM

## 2011-04-12 DIAGNOSIS — I480 Paroxysmal atrial fibrillation: Secondary | ICD-10-CM

## 2011-04-12 DIAGNOSIS — Z7901 Long term (current) use of anticoagulants: Secondary | ICD-10-CM

## 2011-04-22 ENCOUNTER — Encounter: Payer: Medicare Other | Admitting: *Deleted

## 2011-04-26 ENCOUNTER — Ambulatory Visit (INDEPENDENT_AMBULATORY_CARE_PROVIDER_SITE_OTHER): Payer: Medicare Other | Admitting: *Deleted

## 2011-04-26 DIAGNOSIS — I4891 Unspecified atrial fibrillation: Secondary | ICD-10-CM

## 2011-04-26 DIAGNOSIS — Z7901 Long term (current) use of anticoagulants: Secondary | ICD-10-CM

## 2011-04-26 DIAGNOSIS — I480 Paroxysmal atrial fibrillation: Secondary | ICD-10-CM

## 2011-05-11 ENCOUNTER — Ambulatory Visit (INDEPENDENT_AMBULATORY_CARE_PROVIDER_SITE_OTHER): Payer: Medicare Other | Admitting: *Deleted

## 2011-05-11 DIAGNOSIS — I480 Paroxysmal atrial fibrillation: Secondary | ICD-10-CM

## 2011-05-11 DIAGNOSIS — Z7901 Long term (current) use of anticoagulants: Secondary | ICD-10-CM

## 2011-05-11 DIAGNOSIS — I4891 Unspecified atrial fibrillation: Secondary | ICD-10-CM

## 2011-05-26 ENCOUNTER — Ambulatory Visit (INDEPENDENT_AMBULATORY_CARE_PROVIDER_SITE_OTHER): Payer: Medicare Other | Admitting: *Deleted

## 2011-05-26 DIAGNOSIS — I480 Paroxysmal atrial fibrillation: Secondary | ICD-10-CM

## 2011-05-26 DIAGNOSIS — Z7901 Long term (current) use of anticoagulants: Secondary | ICD-10-CM

## 2011-05-26 DIAGNOSIS — I4891 Unspecified atrial fibrillation: Secondary | ICD-10-CM

## 2011-05-26 MED ORDER — WARFARIN SODIUM 2.5 MG PO TABS: 2.5000 mg | ORAL_TABLET | Freq: Every day | ORAL | Status: DC

## 2011-06-21 ENCOUNTER — Ambulatory Visit (INDEPENDENT_AMBULATORY_CARE_PROVIDER_SITE_OTHER): Payer: Medicare Other | Admitting: Cardiovascular Disease

## 2011-06-21 ENCOUNTER — Ambulatory Visit (INDEPENDENT_AMBULATORY_CARE_PROVIDER_SITE_OTHER): Payer: Medicare Other | Admitting: Family Medicine

## 2011-06-21 ENCOUNTER — Ambulatory Visit (INDEPENDENT_AMBULATORY_CARE_PROVIDER_SITE_OTHER): Payer: Medicare Other | Admitting: *Deleted

## 2011-06-21 ENCOUNTER — Telehealth: Payer: Self-pay | Admitting: Physician Assistant

## 2011-06-21 ENCOUNTER — Encounter: Payer: Self-pay | Admitting: Cardiovascular Disease

## 2011-06-21 VITALS — BP 125/65 | HR 51 | Ht 66.0 in | Wt 175.1 lb

## 2011-06-21 VITALS — BP 134/78 | HR 52 | Temp 98.5°F | Resp 18 | Ht 67.0 in | Wt 175.0 lb

## 2011-06-21 DIAGNOSIS — N390 Urinary tract infection, site not specified: Secondary | ICD-10-CM

## 2011-06-21 DIAGNOSIS — R350 Frequency of micturition: Secondary | ICD-10-CM

## 2011-06-21 DIAGNOSIS — I10 Essential (primary) hypertension: Secondary | ICD-10-CM

## 2011-06-21 DIAGNOSIS — I480 Paroxysmal atrial fibrillation: Secondary | ICD-10-CM

## 2011-06-21 DIAGNOSIS — I4891 Unspecified atrial fibrillation: Secondary | ICD-10-CM

## 2011-06-21 DIAGNOSIS — Z7901 Long term (current) use of anticoagulants: Secondary | ICD-10-CM

## 2011-06-21 LAB — POCT UA - MICROSCOPIC ONLY
Casts, Ur, LPF, POC: NEGATIVE
Crystals, Ur, HPF, POC: NEGATIVE
Yeast, UA: NEGATIVE

## 2011-06-21 LAB — POCT URINALYSIS DIPSTICK
Nitrite, UA: NEGATIVE
Protein, UA: NEGATIVE
Spec Grav, UA: 1.015
Urobilinogen, UA: 0.2

## 2011-06-21 MED ORDER — CEPHALEXIN 500 MG PO CAPS: 500.0000 mg | ORAL_CAPSULE | Freq: Three times a day (TID) | ORAL | Status: AC

## 2011-06-21 NOTE — Patient Instructions (Signed)
Your physician wants you to follow-up in: 6 months  You will receive a reminder letter in the mail two months in advance. If you don't receive a letter, please call our office to schedule the follow-up appointment.  Your physician recommends that you continue on your current medications as directed. Please refer to the Current Medication list given to you today.  

## 2011-06-21 NOTE — Progress Notes (Signed)
  Subjective:    Patient ID: Kristy Valencia, female    DOB: 11-28-1919, 78 y.o.   MRN: [id number]  HPI 78 yo female with multiple medical problems and history of recurrent UTI here with concern for UTI.  Increased frequency and hesitancy and dribbling for 2 days.  No pain with urination (but usually doesn't have pain with UTIs) Now on coumadin for PAF   Review of Systems Negative except as per HPI     Objective:   Physical Exam  Constitutional: She appears well-developed and well-nourished.  Cardiovascular: Normal rate, regular rhythm, normal heart sounds and intact distal pulses.   No murmur heard. Pulmonary/Chest: Effort normal and breath sounds normal.  Neurological: She is alert.  Skin: Skin is warm and dry.    No abdominal tenderness or CVA tenderness  Results for orders placed in visit on 06/21/11  POCT UA - MICROSCOPIC ONLY      Component Value Range   WBC, Ur, HPF, POC 3-5     RBC, urine, microscopic 0-1     Bacteria, U Microscopic 1+     Mucus, UA POS     Epithelial cells, urine per micros 6-8     Crystals, Ur, HPF, POC NEG     Casts, Ur, LPF, POC NEG     Yeast, UA NEG    POCT URINALYSIS DIPSTICK      Component Value Range   Color, UA YELLOW     Clarity, UA CLEAR     Glucose, UA NEG     Bilirubin, UA NEG     Ketones, UA NEG     Spec Grav, UA 1.015     Blood, UA trace-intact     pH, UA 6.5     Protein, UA NEG     Urobilinogen, UA 0.2     Nitrite, UA NEG     Leukocytes, UA moderate (2+)         Assessment & Plan:  Urinary frequency - U/A suspicious.  Given coumadin treatment, avoid cipro.  Keflex will be best option.  Will also culture  To ensure sensitivity to keflex.

## 2011-06-21 NOTE — Assessment & Plan Note (Signed)
Her blood pressure is fairly well controlled. We'll continue with her same medications.

## 2011-06-21 NOTE — Progress Notes (Signed)
Kristy Valencia Date of Birth  1919-08-23  [facility] [address]     [address] [place], [place]  [address] [phone]  Fax  [phone]  History of Present Illness:  Kristy Valencia is an 78 year old female who was recently admitted to the hospital with atrial fibrillation associated with the urinary tract infection.   She converted to normal sinus rhythm on IV diltiazem. She's done very well since that point.   She's doing quite well at this point. She has not had any episodes of chest pain or shortness breath, syncope, or presyncope.  Complains of having frequent urination and dysuria. She feels like her abdomen is swollen. She's worried that she may have a urinary tract infection.  Current Outpatient Prescriptions  Medication Sig Dispense Refill  . furosemide (LASIX) 20 MG tablet Take 20 mg by mouth daily.        . metoprolol tartrate (LOPRESSOR) 25 MG tablet Take 25 mg by mouth daily.      . Multiple Vitamin (MULTIVITAMIN) tablet Take 1 tablet by mouth daily.        . Omega-3 Fatty Acids (FISH OIL PO) Take by mouth daily.        . potassium chloride (KLOR-CON 10) 10 MEQ CR tablet Take 1 tablet (10 mEq total) by mouth daily.  30 tablet  6  . warfarin (COUMADIN) 2.5 MG tablet Take 1 tablet (2.5 mg total) by mouth daily. Take as directed by coumadin clinic  60 tablet  3  . DISCONTD: metoprolol tartrate (LOPRESSOR) 25 MG tablet Take 1 tablet (25 mg total) by mouth 2 (two) times daily.  60 tablet  6     Allergies  Allergen Reactions  . Codeine   . Penicillins   . Procardia (Nifedipine)   . Sulfa Drugs Cross Reactors     Past Medical History  Diagnosis Date  . Palpitations   . Abdominal pain     LOWER  . PAF (paroxysmal atrial fibrillation)   . Dysuria   . TB (tuberculosis)   . OA (osteoarthritis)   . GERD (gastroesophageal reflux disease)   . Hypertension   . Macular degeneration   . Compression fracture of L1 lumbar vertebra   . Chest tightness   . VT  (ventricular tachycardia)   . Chronic anticoagulation     Xarelto    Past Surgical History  Procedure Date  . Transthoracic echocardiogram 08/24/10    Left ventricle with wall thickness demonstrating moderate LVH.  Systolic function was decreased with   an estimated ejection fraction of 65-70%.  Pulmonary artery peak  pressure of 34 mmHg  . Chest x-ray 08/21/2010    Cardiomegaly with mild edema  . Breast biopsy     X2  . Appendectomy     History  Smoking status  . Never Smoker   Smokeless tobacco  . Not on file    History  Alcohol Use No    Family History  Problem Relation Age of Onset  . Stomach cancer Father     Reviw of Systems:  Reviewed in the HPI.  All other systems are negative.  Physical Exam: BP 125/65  Pulse 51  Ht 5\' 6"  (1.676 m)  Wt 175 lb 1.9 oz (79.434 kg)  BMI 28.27 kg/m2 The patient is alert and oriented x 3.  The mood and affect are normal.   Skin: warm and dry.  Color is normal.    HEENT:   the sclera are nonicteric.  The  mucous membranes are moist.  The carotids are 2+ without bruits.  There is no thyromegaly.  There is no JVD.    Lungs: clear.  The chest wall is non tender.    Heart: regular rate with a normal S1 and S2.  There are no murmurs, gallops, or rubs. The PMI is not displaced.     Abdomen: good bowel sounds.  There is no guarding or rebound.  There is no hepatosplenomegaly or tenderness.  There are no masses.   Extremities:  no clubbing, cyanosis, or edema.  The legs are without rashes.  The distal pulses are intact.   Neuro:  Cranial nerves II - XII are intact.  Motor and sensory functions are intact.    The gait is normal.  ECG: Sinus bradycardia. Otherwise the EKG is normal. Assessment / Plan:

## 2011-06-21 NOTE — Assessment & Plan Note (Signed)
Kristy Valencia complains of having symptoms related to urinary tract infection. She has dysuria and urinary frequency. She also complains of an a distended abdomen.  [facility]  She would like Kristy Valencia to come to their office and they can get a urine sample.

## 2011-06-21 NOTE — Assessment & Plan Note (Addendum)
Kristy Valencia is doing well. She still in sinus rhythm. She'll continue with her same medications.

## 2011-06-21 NOTE — Telephone Encounter (Signed)
The patient is currently at Dr. Harvie Bridge office for follow-up of AFib.  She is complaining or urinary urgency and frequency and abdominal fullness.  He suspects that she has a UTI, but their office does not have the ability to perform the appropriate evaluation.  I advised Dr. Elease Hashimoto to send  The patient over to our office for evaluation and treatment.  He agrees.

## 2011-06-23 LAB — URINE CULTURE

## 2011-07-07 ENCOUNTER — Other Ambulatory Visit: Payer: Self-pay | Admitting: *Deleted

## 2011-07-07 MED ORDER — WARFARIN SODIUM 2.5 MG PO TABS: 2.5000 mg | ORAL_TABLET | ORAL | Status: DC

## 2011-07-28 ENCOUNTER — Other Ambulatory Visit: Payer: Self-pay | Admitting: *Deleted

## 2011-07-28 MED ORDER — POTASSIUM CHLORIDE ER 10 MEQ PO TBCR: 10.0000 meq | EXTENDED_RELEASE_TABLET | Freq: Every day | ORAL | Status: DC

## 2011-07-28 MED ORDER — METOPROLOL TARTRATE 25 MG PO TABS: 25.0000 mg | ORAL_TABLET | Freq: Every day | ORAL | Status: DC

## 2011-07-28 NOTE — Telephone Encounter (Signed)
Fax Received. Refill Completed. Kristy Valencia (R.M.A)   

## 2011-07-30 ENCOUNTER — Ambulatory Visit (INDEPENDENT_AMBULATORY_CARE_PROVIDER_SITE_OTHER): Payer: Medicare Other

## 2011-07-30 DIAGNOSIS — I480 Paroxysmal atrial fibrillation: Secondary | ICD-10-CM

## 2011-07-30 DIAGNOSIS — I4891 Unspecified atrial fibrillation: Secondary | ICD-10-CM

## 2011-07-30 DIAGNOSIS — Z7901 Long term (current) use of anticoagulants: Secondary | ICD-10-CM

## 2011-08-13 ENCOUNTER — Ambulatory Visit (INDEPENDENT_AMBULATORY_CARE_PROVIDER_SITE_OTHER): Payer: Medicare Other | Admitting: Family Medicine

## 2011-08-13 ENCOUNTER — Encounter: Payer: Self-pay | Admitting: Family Medicine

## 2011-08-13 VITALS — BP 138/65 | HR 52 | Temp 97.7°F | Resp 17 | Ht 66.0 in | Wt 175.4 lb

## 2011-08-13 DIAGNOSIS — H3530 Unspecified macular degeneration: Secondary | ICD-10-CM | POA: Insufficient documentation

## 2011-08-13 DIAGNOSIS — R413 Other amnesia: Secondary | ICD-10-CM

## 2011-08-13 DIAGNOSIS — R2681 Unsteadiness on feet: Secondary | ICD-10-CM | POA: Insufficient documentation

## 2011-08-13 DIAGNOSIS — B351 Tinea unguium: Secondary | ICD-10-CM

## 2011-08-13 DIAGNOSIS — I480 Paroxysmal atrial fibrillation: Secondary | ICD-10-CM

## 2011-08-13 DIAGNOSIS — I4891 Unspecified atrial fibrillation: Secondary | ICD-10-CM

## 2011-08-13 NOTE — Progress Notes (Signed)
77 yo woman who withdrew significantly when her husband died in 2002-09-21.  She worked until she was 79 from the [employer].  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    Daughter checks BP at home, values always around 100 systolic She was originally diagnosed with a. Fib. When she had a UTI years ago.  She had 7 older brothers, 1 died of TB at 61.  She told me this several times.  O:  NAD, using cane, mildly forgetful  BP 110/50 Skin:  Multiple seb keratoses, onychomycosis Eyes:  Macular pallor OS Chest: clear Heart:  Regular, no murmur Ext:  Trace edema Abdomen:  Soft, no HSM or masses  A: elderly woman who lives with attentive daughter.  Very stable for years with no recurrence of the A. Fib.  She has mild memory loss and an unstable gait, and for these reasons I feel that the warfarin is more risk than she should take. Also, her blood pressure is low enough to warrant stopping the Metoprolol, especially since the only time she had the a. Fib was when she was sick  P: Discontinue the Warfarin, the Metoprolol Take the Lasix prn with the potassium Continue the Aspirin 81 mg, Centrum, Fish Oil Continue to monitor the BP at home by daughter, who has caretaker experience in a nursing home.

## 2011-08-13 NOTE — Patient Instructions (Signed)
Discontinue the Warfarin, the Metoprolol Take the Lasix prn with the potassium Continue the Aspirin 81 mg, Centrum, Fish Oil

## 2011-09-24 ENCOUNTER — Other Ambulatory Visit: Payer: Self-pay | Admitting: Family Medicine

## 2011-10-21 ENCOUNTER — Ambulatory Visit: Payer: Self-pay | Admitting: Cardiology

## 2012-05-10 ENCOUNTER — Emergency Department (HOSPITAL_COMMUNITY)
Admission: EM | Admit: 2012-05-10 | Discharge: 2012-05-10 | Disposition: A | Discharge status: Home / Self Care | Payer: Medicare Other | Attending: Emergency Medicine | Admitting: Emergency Medicine

## 2012-05-10 ENCOUNTER — Emergency Department (INDEPENDENT_AMBULATORY_CARE_PROVIDER_SITE_OTHER): Payer: Medicare Other

## 2012-05-10 ENCOUNTER — Encounter (HOSPITAL_COMMUNITY): Payer: Self-pay

## 2012-05-10 ENCOUNTER — Encounter (HOSPITAL_COMMUNITY): Payer: Self-pay | Admitting: Adult Health

## 2012-05-10 ENCOUNTER — Emergency Department (HOSPITAL_COMMUNITY)
Admission: EM | Admit: 2012-05-10 | Discharge: 2012-05-10 | Disposition: A | Discharge status: Other Acute Inpatient Hospital | Payer: Medicare Other | Source: Home / Self Care | Attending: Family Medicine | Admitting: Family Medicine

## 2012-05-10 DIAGNOSIS — M79609 Pain in unspecified limb: Secondary | ICD-10-CM

## 2012-05-10 DIAGNOSIS — R0989 Other specified symptoms and signs involving the circulatory and respiratory systems: Secondary | ICD-10-CM

## 2012-05-10 DIAGNOSIS — Z7982 Long term (current) use of aspirin: Secondary | ICD-10-CM | POA: Insufficient documentation

## 2012-05-10 DIAGNOSIS — M7989 Other specified soft tissue disorders: Secondary | ICD-10-CM

## 2012-05-10 DIAGNOSIS — I10 Essential (primary) hypertension: Secondary | ICD-10-CM | POA: Insufficient documentation

## 2012-05-10 DIAGNOSIS — S82899A Other fracture of unspecified lower leg, initial encounter for closed fracture: Secondary | ICD-10-CM

## 2012-05-10 DIAGNOSIS — Z8781 Personal history of (healed) traumatic fracture: Secondary | ICD-10-CM | POA: Insufficient documentation

## 2012-05-10 DIAGNOSIS — Z8679 Personal history of other diseases of the circulatory system: Secondary | ICD-10-CM | POA: Insufficient documentation

## 2012-05-10 DIAGNOSIS — I480 Paroxysmal atrial fibrillation: Secondary | ICD-10-CM

## 2012-05-10 DIAGNOSIS — W19XXXA Unspecified fall, initial encounter: Secondary | ICD-10-CM | POA: Insufficient documentation

## 2012-05-10 DIAGNOSIS — Z79899 Other long term (current) drug therapy: Secondary | ICD-10-CM | POA: Insufficient documentation

## 2012-05-10 DIAGNOSIS — Z8669 Personal history of other diseases of the nervous system and sense organs: Secondary | ICD-10-CM | POA: Insufficient documentation

## 2012-05-10 DIAGNOSIS — I4891 Unspecified atrial fibrillation: Secondary | ICD-10-CM | POA: Insufficient documentation

## 2012-05-10 DIAGNOSIS — Z8719 Personal history of other diseases of the digestive system: Secondary | ICD-10-CM | POA: Insufficient documentation

## 2012-05-10 DIAGNOSIS — M1990 Unspecified osteoarthritis, unspecified site: Secondary | ICD-10-CM | POA: Insufficient documentation

## 2012-05-10 DIAGNOSIS — Z7901 Long term (current) use of anticoagulants: Secondary | ICD-10-CM | POA: Insufficient documentation

## 2012-05-10 DIAGNOSIS — S8263XA Displaced fracture of lateral malleolus of unspecified fibula, initial encounter for closed fracture: Secondary | ICD-10-CM

## 2012-05-10 DIAGNOSIS — Y929 Unspecified place or not applicable: Secondary | ICD-10-CM | POA: Insufficient documentation

## 2012-05-10 DIAGNOSIS — Y939 Activity, unspecified: Secondary | ICD-10-CM | POA: Insufficient documentation

## 2012-05-10 DIAGNOSIS — Z8611 Personal history of tuberculosis: Secondary | ICD-10-CM | POA: Insufficient documentation

## 2012-05-10 NOTE — ED Notes (Signed)
Pt arrives from UC, requesting doppler of lower extremity to rule out clot. A&Ox4, ambulatory w/ cane, nad.

## 2012-05-10 NOTE — ED Notes (Signed)
Fell at home last week (unsure mechanism of fall), was reportedly able to walk w/o difficulty until today , when has begun to have pain w ambulation right foot ( DP pulse palpated ) denies other pain or injury ; NAD

## 2012-05-10 NOTE — Progress Notes (Signed)
Right:  No evidence of DVT, superficial thrombosis, or Baker's cyst.  Left:  Negative for DVT in the common femoral vein.  

## 2012-05-10 NOTE — ED Provider Notes (Signed)
History   This chart was scribed for non-physician practitioner working with Gilda Crease, by Gerlean Ren, ED Scribe. This patient was seen in room TR11C/TR11C and the patient's care was started at 5:48 PM.    CSN: [id number]  Arrival date & time 05/10/12  1622   First MD Initiated Contact with Patient 05/10/12 1720      Chief Complaint  Patient presents with  . Leg Injury     The history is provided by the patient, medical records and a relative. No language interpreter was used.  Kristy Valencia is a 79 y.o. female with h/o PAF, HTN who presents to the Emergency Department sent from Urgent Care to rule out a blood clot.  Pt presented to Urgent Care for one week of gradually worsening right ankle pain since falling one week ago with a fracture discovered.  Pt states the boot one right foot has relieved the pain.  Pt denies any current pain.  Pt denies tobacco and alcohol use.   Past Medical History  Diagnosis Date  . Palpitations   . Abdominal pain     LOWER  . PAF (paroxysmal atrial fibrillation)   . Dysuria   . TB (tuberculosis)   . OA (osteoarthritis)   . GERD (gastroesophageal reflux disease)   . Hypertension   . Macular degeneration   . Compression fracture of L1 lumbar vertebra   . Chest tightness   . VT (ventricular tachycardia)   . Chronic anticoagulation     Xarelto    Past Surgical History  Procedure Date  . Transthoracic echocardiogram 08/24/10    Left ventricle with wall thickness demonstrating moderate LVH.  Systolic function was decreased with   an estimated ejection fraction of 65-70%.  Pulmonary artery peak  pressure of 34 mmHg  . Chest x-ray 08/21/2010    Cardiomegaly with mild edema  . Breast biopsy     X2  . Appendectomy     Family History  Problem Relation Age of Onset  . Stomach cancer Father     History  Substance Use Topics  . Smoking status: Never Smoker   . Smokeless tobacco: Not on file  . Alcohol Use: No    No OB history  provided.   Review of Systems  Constitutional: Negative for fever, diaphoresis, appetite change, fatigue and unexpected weight change.  HENT: Negative for mouth sores and neck stiffness.   Eyes: Negative for visual disturbance.  Respiratory: Negative for cough, chest tightness, shortness of breath and wheezing.   Cardiovascular: Negative for chest pain.  Gastrointestinal: Negative for nausea, vomiting, abdominal pain, diarrhea and constipation.  Genitourinary: Negative for dysuria, urgency, frequency and hematuria.  Musculoskeletal: Negative for back pain.  Skin: Negative for rash.  Neurological: Negative for syncope, light-headedness and headaches.  Hematological: Does not bruise/bleed easily.  Psychiatric/Behavioral: Negative for sleep disturbance. The patient is not nervous/anxious.   All other systems reviewed and are negative.    Allergies  Azithromycin; Codeine; Penicillins; Procardia; and Sulfa drugs cross reactors  Home Medications   Current Outpatient Rx  Name  Route  Sig  Dispense  Refill  . ASPIRIN 81 MG PO TABS   Oral   Take 81 mg by mouth daily.         . FUROSEMIDE 20 MG PO TABS   Oral   Take 20 mg by mouth 2 (two) times daily.         . ADULT MULTIVITAMIN W/MINERALS CH   Oral   Take  1 tablet by mouth daily.           BP 151/75  Pulse 82  Temp 97.9 F (36.6 C) (Oral)  Resp 16  SpO2 100%  Physical Exam  Nursing note and vitals reviewed. Constitutional: She is oriented to person, place, and time. She appears well-developed and well-nourished. No distress.  HENT:  Head: Normocephalic and atraumatic.  Mouth/Throat: Oropharynx is clear and moist. No oropharyngeal exudate.  Eyes: Conjunctivae normal are normal. Pupils are equal, round, and reactive to light. No scleral icterus.  Neck: Normal range of motion. Neck supple.  Cardiovascular: Normal rate, regular rhythm, normal heart sounds and intact distal pulses.  Exam reveals no gallop and no  friction rub.   No murmur heard.      Capillary refill < 3 sec   Pulmonary/Chest: Effort normal and breath sounds normal. No respiratory distress. She has no wheezes. She has no rales. She exhibits no tenderness.  Abdominal: Soft. Bowel sounds are normal. She exhibits no mass. There is no tenderness. There is no rebound and no guarding.  Musculoskeletal: Normal range of motion. She exhibits no edema.  Lymphadenopathy:    She has no cervical adenopathy.  Neurological: She is alert and oriented to person, place, and time. She exhibits normal muscle tone. Coordination normal.       Speech is clear and goal oriented Moves extremities without ataxia  Skin: Skin is warm and dry. No rash noted. She is not diaphoretic. No erythema.  Psychiatric: She has a normal mood and affect.    ED Course  Procedures (including critical care time) DIAGNOSTIC STUDIES: Oxygen Saturation is 100% on room air, normal by my interpretation.    COORDINATION OF CARE: 5:52 PM- Patient informed of clinical course, understands medical decision-making process, and agrees with plan.  Dg Tibia/fibula Right  05/10/2012  *RADIOLOGY REPORT*  Clinical Data: Fall with bruising and swelling around the lower leg.  RIGHT TIBIA AND FIBULA - 2 VIEW  Comparison: None.  Findings: Two-view exam of the right tibia and fibula shows diffuse bony demineralization.  Subtle fracture identified at the tip of the lateral malleolus.  No worrisome lytic or sclerotic osseous abnormality.  IMPRESSION: Nondisplaced fracture involving the tip of the lateral malleolus.   Original Report Authenticated By: Kennith Center, M.D.    Dg Ankle Complete Right  05/10/2012  *RADIOLOGY REPORT*  Clinical Data: The ankle pain after fall.  RIGHT ANKLE - COMPLETE 3+ VIEW  Comparison: None.  Findings: Three-view exam shows a fracture of the lateral malleolus with overlying soft tissue swelling.  No other acute bony abnormality is evident.  Ankle mortise is preserved.  No  worrisome lytic or sclerotic osseous abnormality.  Bones are diffusely demineralized.  IMPRESSION: Acute fracture involving the tip of the lateral malleolus.   Original Report Authenticated By: Kennith Center, M.D.    Dg Foot Complete Right  05/10/2012  *RADIOLOGY REPORT*  Clinical Data: Fall.  Bruising and swelling in the foot.  RIGHT FOOT COMPLETE - 3+ VIEW  Comparison: None.  Findings: Osteoarthritis noted in the toes.  No malalignment at the Lisfranc joint.  No fracture observed.  Achilles calcaneal spur noted.  Dorsal soft tissue swelling noted along the foot.  Several hammertoe deformities are believed to be present.  IMPRESSION:  1.  Osteoarthritis. 2.  Suspected hammertoe deformities. 3.  Soft tissue swelling along the foot. 4.  Achilles calcaneal spur.   Original Report Authenticated By: Gaylyn Rong, M.D.      1.  Suspected DVT (deep vein thrombosis)   2. PAF (paroxysmal atrial fibrillation)   3. Malleolar fracture       MDM  Kristy Valencia presents for further evaluation after broken ankle. Patient seen in urgent care today and diagnosed with small nondisplaced lateral malleolus fracture with significant bruising and swelling. Urgent care concerned about a DVT and patient was sent here. She has Cam Walker in place.    LE Venous Dopplar: Right: No evidence of DVT, superficial thrombosis, or Baker's cyst. Left: Negative for DVT in the common femoral vein.  No DVT seen on venous Doppler. Patient will be discharged home with followup with her physician.  1. Medications: usual home medications 2. Treatment: rest, drink plenty of fluids, rest, ice, elevation, wear your cam boot at all times 3. Follow Up: Please followup with your primary doctor for discussion of your diagnoses and further evaluation after today's visit; if you do not have a primary care doctor use the resource guide provided to find one   I personally performed the services described in this documentation, which was  scribed in my presence. The recorded information has been reviewed and is accurate.         Dahlia Client Jadd Gasior, PA-C 05/10/12 1828

## 2012-05-10 NOTE — ED Notes (Signed)
PT fell over one week ago and injured right leg, seen at [facility] and placed in a boot, set over here for a Doppler of the right leg to rule out blood clot as well. CMS intact.

## 2012-05-10 NOTE — ED Provider Notes (Signed)
History     CSN: [id number]  Arrival date & time 05/10/12  1131   First MD Initiated Contact with Patient 05/10/12 1154      Chief Complaint  Patient presents with  . Fall    (Consider location/radiation/quality/duration/timing/severity/associated sxs/prior treatment) HPI Comments: A 79 year old female with multiple comorbidities including A. Fib, hypertension and issues with memory loss. Patient has been off warfarin since May 2013. She takes daily baby aspirin. Here with daughter concerned about pain and swelling in her right lower leg for over one week. Patient states she "fell last week while going up or down stairs"  (as per daughter patient does not recollect past events around her fall well) daughter states she leaves with patient and patient was never found in the floor, daughter noticed the bruise and swelling in her right lower leg while patient was sitting in her chair last week. Her right ankle and right foot has been swollen and bruised since. Swelling and pain appears getting worse. Patient does walk with a cane and has been putting weight on the right leg. Daughter is giving her Tylenol intermittently for pain. Denies chest pain or shortness of breath. No dizziness or syncope.    Past Medical History  Diagnosis Date  . Palpitations   . Abdominal pain     LOWER  . PAF (paroxysmal atrial fibrillation)   . Dysuria   . TB (tuberculosis)   . OA (osteoarthritis)   . GERD (gastroesophageal reflux disease)   . Hypertension   . Macular degeneration   . Compression fracture of L1 lumbar vertebra   . Chest tightness   . VT (ventricular tachycardia)   . Chronic anticoagulation     Xarelto    Past Surgical History  Procedure Date  . Transthoracic echocardiogram 08/24/10    Left ventricle with wall thickness demonstrating moderate LVH.  Systolic function was decreased with   an estimated ejection fraction of 65-70%.  Pulmonary artery peak  pressure of 34 mmHg  . Chest x-ray  08/21/2010    Cardiomegaly with mild edema  . Breast biopsy     X2  . Appendectomy     Family History  Problem Relation Age of Onset  . Stomach cancer Father     History  Substance Use Topics  . Smoking status: Never Smoker   . Smokeless tobacco: Not on file  . Alcohol Use: No    OB History    Grav Para Term Preterm Abortions TAB SAB Ect Mult Living                  Review of Systems  Constitutional: Negative for fever and chills.  Respiratory: Negative for cough, chest tightness and shortness of breath.   Cardiovascular: Negative for chest pain and palpitations.  Musculoskeletal:       Right foot, ankle and lower leg bruising, swelling and pain as per history of present illness  Skin: Negative for wound.  Neurological: Negative for dizziness, syncope, light-headedness and headaches.    Allergies  Azithromycin; Codeine; Penicillins; Procardia; and Sulfa drugs cross reactors  Home Medications   Current Outpatient Rx  Name  Route  Sig  Dispense  Refill  . ASPIRIN 81 MG PO TABS   Oral   Take 81 mg by mouth daily.         . FUROSEMIDE 20 MG PO TABS      TAKE 1 TABLET EVERY DAY   90 tablet   3   . ONE-DAILY MULTI  VITAMINS PO TABS   Oral   Take 1 tablet by mouth daily.           Marland Kitchen FISH OIL PO   Oral   Take by mouth daily.           Marland Kitchen POTASSIUM CHLORIDE ER 10 MEQ PO TBCR   Oral   Take 1 tablet (10 mEq total) by mouth daily.   90 tablet   3     BP 152/69  Pulse 65  Temp 97.6 F (36.4 C) (Oral)  Resp 20  SpO2 100%  Physical Exam  Nursing note and vitals reviewed. Constitutional: She is oriented to person, place, and time. She appears well-developed and well-nourished. No distress.       Pleasant, talkative, cooperative.  HENT:  Head: Normocephalic and atraumatic.  Eyes: Conjunctivae normal and EOM are normal. Pupils are equal, round, and reactive to light.  Neck: Neck supple. No JVD present.  Cardiovascular: Normal rate and regular  rhythm.   Pulmonary/Chest: Effort normal and breath sounds normal. No respiratory distress. She has no wheezes. She has no rales. She exhibits no tenderness.  Musculoskeletal:       Right foot. There is dorsal swelling and bruising extending to proximal phalanx of second and third and fourth toes. Diffuse tenderness to palpation. Right ankle bilateral malleolar swelling and diffuse tenderness to palpation. Patient able to flex and extend ankle with reported pain. Lower leg: Diffuse tenderness with 1+ pitting pretibial edema associated with extensive bruising from mid lower leg down to ankle and foot. No significant upper calf tenderness or swelling. No skin breaks, ulcers, abrasions or lacerations. Dorsal pedal pulse palpated. Right lower extremity feels colder than left. No cyanosis.   Neurological: She is alert and oriented to person, place, and time.  Skin: She is not diaphoretic.       No abrasions or lacerations.    ED Course  Procedures (including critical care time)  Labs Reviewed - No data to display Dg Tibia/fibula Right  05/10/2012  *RADIOLOGY REPORT*  Clinical Data: Fall with bruising and swelling around the lower leg.  RIGHT TIBIA AND FIBULA - 2 VIEW  Comparison: None.  Findings: Two-view exam of the right tibia and fibula shows diffuse bony demineralization.  Subtle fracture identified at the tip of the lateral malleolus.  No worrisome lytic or sclerotic osseous abnormality.  IMPRESSION: Nondisplaced fracture involving the tip of the lateral malleolus.   Original Report Authenticated By: Kennith Center, M.D.    Dg Ankle Complete Right  05/10/2012  *RADIOLOGY REPORT*  Clinical Data: The ankle pain after fall.  RIGHT ANKLE - COMPLETE 3+ VIEW  Comparison: None.  Findings: Three-view exam shows a fracture of the lateral malleolus with overlying soft tissue swelling.  No other acute bony abnormality is evident.  Ankle mortise is preserved.  No worrisome lytic or sclerotic osseous abnormality.   Bones are diffusely demineralized.  IMPRESSION: Acute fracture involving the tip of the lateral malleolus.   Original Report Authenticated By: Kennith Center, M.D.    Dg Foot Complete Right  05/10/2012  *RADIOLOGY REPORT*  Clinical Data: Fall.  Bruising and swelling in the foot.  RIGHT FOOT COMPLETE - 3+ VIEW  Comparison: None.  Findings: Osteoarthritis noted in the toes.  No malalignment at the Lisfranc joint.  No fracture observed.  Achilles calcaneal spur noted.  Dorsal soft tissue swelling noted along the foot.  Several hammertoe deformities are believed to be present.  IMPRESSION:  1.  Osteoarthritis. 2.  Suspected hammertoe deformities. 3.  Soft tissue swelling along the foot. 4.  Achilles calcaneal spur.   Original Report Authenticated By: Gaylyn Rong, M.D.      1. Lateral malleolar fracture   2. Leg swelling       MDM  Small nondisplaced lateral malleolar fracture on x-rays. Bruising and swelling degrees concerning for DVT.  A Cam Walker was placed. Decided to transfer to the emergency department for further evaluation and management.        Sharin Grave, MD 05/10/12 1555

## 2012-05-11 NOTE — ED Provider Notes (Signed)
Medical screening examination/treatment/procedure(s) were performed by non-physician practitioner and as supervising physician I was immediately available for consultation/collaboration.   Gilda Crease, MD 05/11/12 1101

## 2012-06-15 ENCOUNTER — Ambulatory Visit: Payer: Medicare Other | Admitting: Family Medicine

## 2012-07-23 ENCOUNTER — Emergency Department (HOSPITAL_COMMUNITY)
Admission: EM | Admit: 2012-07-23 | Discharge: 2012-07-23 | Disposition: A | Discharge status: Home / Self Care | Payer: Medicare Other | Attending: Emergency Medicine | Admitting: Emergency Medicine

## 2012-07-23 ENCOUNTER — Emergency Department (HOSPITAL_COMMUNITY): Payer: Medicare Other

## 2012-07-23 ENCOUNTER — Encounter (HOSPITAL_COMMUNITY): Payer: Self-pay | Admitting: *Deleted

## 2012-07-23 DIAGNOSIS — Z8739 Personal history of other diseases of the musculoskeletal system and connective tissue: Secondary | ICD-10-CM | POA: Insufficient documentation

## 2012-07-23 DIAGNOSIS — R220 Localized swelling, mass and lump, head: Secondary | ICD-10-CM | POA: Insufficient documentation

## 2012-07-23 DIAGNOSIS — Z79899 Other long term (current) drug therapy: Secondary | ICD-10-CM | POA: Insufficient documentation

## 2012-07-23 DIAGNOSIS — W108XXA Fall (on) (from) other stairs and steps, initial encounter: Secondary | ICD-10-CM | POA: Insufficient documentation

## 2012-07-23 DIAGNOSIS — Z7982 Long term (current) use of aspirin: Secondary | ICD-10-CM | POA: Insufficient documentation

## 2012-07-23 DIAGNOSIS — T148XXA Other injury of unspecified body region, initial encounter: Secondary | ICD-10-CM | POA: Insufficient documentation

## 2012-07-23 DIAGNOSIS — Z8611 Personal history of tuberculosis: Secondary | ICD-10-CM | POA: Insufficient documentation

## 2012-07-23 DIAGNOSIS — Z8679 Personal history of other diseases of the circulatory system: Secondary | ICD-10-CM | POA: Insufficient documentation

## 2012-07-23 DIAGNOSIS — Z8781 Personal history of (healed) traumatic fracture: Secondary | ICD-10-CM | POA: Insufficient documentation

## 2012-07-23 DIAGNOSIS — I10 Essential (primary) hypertension: Secondary | ICD-10-CM | POA: Insufficient documentation

## 2012-07-23 DIAGNOSIS — Y9289 Other specified places as the place of occurrence of the external cause: Secondary | ICD-10-CM | POA: Insufficient documentation

## 2012-07-23 DIAGNOSIS — T07XXXA Unspecified multiple injuries, initial encounter: Secondary | ICD-10-CM

## 2012-07-23 DIAGNOSIS — Y9389 Activity, other specified: Secondary | ICD-10-CM | POA: Insufficient documentation

## 2012-07-23 DIAGNOSIS — Z23 Encounter for immunization: Secondary | ICD-10-CM | POA: Insufficient documentation

## 2012-07-23 DIAGNOSIS — K219 Gastro-esophageal reflux disease without esophagitis: Secondary | ICD-10-CM | POA: Insufficient documentation

## 2012-07-23 MED ORDER — TETANUS-DIPHTHERIA TOXOIDS TD 5-2 LFU IM INJ
0.5000 mL | INJECTION | Freq: Once | INTRAMUSCULAR | Status: AC
  Administered 2012-07-23: 0.5 mL via INTRAMUSCULAR
  Filled 2012-07-23: qty 0.5

## 2012-07-23 NOTE — ED Provider Notes (Signed)
History     CSN: [id number]  Arrival date & time 07/23/12  2029   First MD Initiated Contact with Patient 07/23/12 2050      Chief Complaint  Patient presents with  . Fall    (Consider location/radiation/quality/duration/timing/severity/associated sxs/prior treatment) HPI Comments: Kristy Valencia is a 79 year old female, who is with her daughter daughter states that she was upstairs when her mother called her for help.  She was standing at the bottom of the steps, with bloody.  Pain.  Patient is unable to relate how she cut her legs, but she now has a laceration to the anterior portions of both right and left shins.  Left being more of a avulsion, laceration, right, more bruising with a less significant laceration.  Patient has been and the tori.  Since, unknown when her last tetanus immunization was received.  Denies any other injury  Patient is a 79 y.o. female presenting with fall. The history is provided by a relative and a caregiver. The history is limited by the condition of the patient.  Fall The accident occurred less than 1 hour ago. The fall occurred in unknown circumstances. Pertinent negatives include no vomiting.    Past Medical History  Diagnosis Date  . Palpitations   . Abdominal pain     LOWER  . PAF (paroxysmal atrial fibrillation)   . Dysuria   . TB (tuberculosis)   . OA (osteoarthritis)   . GERD (gastroesophageal reflux disease)   . Hypertension   . Macular degeneration   . Compression fracture of L1 lumbar vertebra   . Chest tightness   . VT (ventricular tachycardia)   . Chronic anticoagulation     Xarelto    Past Surgical History  Procedure Laterality Date  . Transthoracic echocardiogram  08/24/10    Left ventricle with wall thickness demonstrating moderate LVH.  Systolic function was decreased with   an estimated ejection fraction of 65-70%.  Pulmonary artery peak  pressure of 34 mmHg  . Chest x-ray  08/21/2010    Cardiomegaly with mild edema  . Breast  biopsy      X2  . Appendectomy      Family History  Problem Relation Age of Onset  . Stomach cancer Father     History  Substance Use Topics  . Smoking status: Never Smoker   . Smokeless tobacco: Not on file  . Alcohol Use: No    OB History   Grav Para Term Preterm Abortions TAB SAB Ect Mult Living                  Review of Systems  Constitutional: Negative for activity change.  HENT: Positive for facial swelling. Negative for neck pain.   Respiratory: Negative for shortness of breath.   Cardiovascular: Positive for leg swelling.  Gastrointestinal: Negative for vomiting.  Musculoskeletal: Negative for joint swelling.  Skin: Positive for wound.  All other systems reviewed and are negative.    Allergies  Codeine; Procardia; Penicillins; Sulfa drugs cross reactors; and Azithromycin  Home Medications   Current Outpatient Rx  Name  Route  Sig  Dispense  Refill  . aspirin 81 MG tablet   Oral   Take 81 mg by mouth daily.         . furosemide (LASIX) 20 MG tablet   Oral   Take 20 mg by mouth daily.            BP 152/73  Pulse 90  Temp(Src) 97.9 F (36.6  C) (Oral)  Resp 18  SpO2 100%  Physical Exam  Constitutional: She appears well-developed and well-nourished. No distress.  HENT:  Head: Normocephalic and atraumatic.  Eyes: Pupils are equal, round, and reactive to light.  Neck: Normal range of motion.  Cardiovascular: Normal rate.   Pulmonary/Chest: Effort normal.  Abdominal: Soft. Bowel sounds are normal.  Musculoskeletal: Normal range of motion. She exhibits tenderness. She exhibits no edema.  Neurological: She is alert.  Skin: Skin is warm.    ED Course  LACERATION REPAIR Date/Time: 07/23/2012 9:41 PM Performed by: Arman Filter Authorized by: Arman Filter Consent: Verbal consent obtained. Risks and benefits: risks, benefits and alternatives were discussed Consent given by: patient Patient understanding: patient states understanding of  the procedure being performed Patient identity confirmed: verbally with patient Time out: Immediately prior to procedure a "time out" was called to verify the correct patient, procedure, equipment, support staff and site/side marked as required. Body area: lower extremity Location details: left lower leg Laceration length: 12 cm Foreign bodies: no foreign bodies Tendon involvement: none Nerve involvement: none Vascular damage: no Anesthesia: local infiltration Local anesthetic: lidocaine 1% without epinephrine Anesthetic total: 3 ml Patient sedated: no Preparation: Patient was prepped and draped in the usual sterile fashion. Irrigation solution: saline Amount of cleaning: standard Debridement: none Degree of undermining: none Skin closure: 4-0 Prolene and Steri-Strips Number of sutures: 11 Technique: simple Approximation: loose Approximation difficulty: simple Dressing: non-adhesive packing strip and gauze roll Patient tolerance: Patient tolerated the procedure well with no immediate complications. Comments: Steri-Strips were placed to hold the skin avulsion in place.  6 of the 11th sutures were placed through the Steri-Strips to to the thinness of her skin   (including critical care time)  Labs Reviewed - No data to display Dg Tibia/fibula Left  07/23/2012  *RADIOLOGY REPORT*  Clinical Data: Fall, leg pain and laceration  LEFT TIBIA AND FIBULA - 2 VIEW  Comparison: Contemporaneous contralateral leg  Findings: No displaced fracture.  No aggressive osseous lesions. Subcutaneous soft tissue calcifications.  No radiopaque foreign body.  Mild soft tissue irregularity overlying the distal leg, with overlying bandage, corresponds to a contusion/laceration. Posterior calcaneal enthesopathic change.  IMPRESSION: Contusion/mild laceration involving the distal leg.  No acute osseous finding.   Original Report Authenticated By: Jearld Lesch, M.D.    Dg Tibia/fibula Right  07/23/2012   *RADIOLOGY REPORT*  Clinical Data: Fall, leg laceration  RIGHT TIBIA AND FIBULA - 2 VIEW  Comparison: 05/10/2012  Findings: Soft tissue irregularity along the anterior lateral lower leg.  No radiopaque foreign body.  No underlying fracture identified. There is osseous irregularity at the tip of the lateral malleolus, which corresponds to the level of a prior fracture. Posterior calcaneal enthesopathic change.  No aggressive osseous finding.  IMPRESSION: Anterolateral lower leg laceration.  No underlying osseous finding.  Osseous irregularity at the tip of the lateral malleolus, corresponds to the site of recent prior fracture.  Correlate with point tenderness if concerned for re-injury.   Original Report Authenticated By: Jearld Lesch, M.D.      1. Lacerations of multiple sites without complication    LACERATION REPAIR Performed by: Arman Filter Authorized by: Arman Filter Consent: Verbal consent obtained. Risks and benefits: risks, benefits and alternatives were discussed Consent given by: patient Patient identity confirmed: provided demographic data Prepped and Draped in normal sterile fashion Wound explored  Laceration Location: Right shin  Laceration Length: 3 cm  No Foreign Bodies seen or palpated  Anesthesia: local infiltration  Local anesthetic: lidocaine 1 % without epinephrine  Anesthetic total: 1 ml  Irrigation method: syringe Amount of cleaning: standard  Skin closure: Loose   Number of sutures: 4   Technique: Simple, interrupted   Patient tolerance: Patient tolerated the procedure well with no immediate complications.   MDM   No other sign of trauma.  Has been identified, patient is in the tori.  In the room denies dizziness chest pain, shortness of breath        Arman Filter, NP 07/23/12 2333

## 2012-07-23 NOTE — ED Notes (Signed)
The pt fell up some metal steps earlier today.  She has cuts and abrasions to the rt and lt lower extremities

## 2012-07-23 NOTE — ED Provider Notes (Signed)
Patient currently lacerated her legs while walking earlier today. Suffered lacerations to bilateral lower extremities as result of event. Lacerations repaired by Ms. Manus Rudd. Patient is alert ambulates without difficulty Glasgow Coma Score 15.  Doug Sou, MD 07/23/12 [phone]

## 2012-07-24 NOTE — ED Provider Notes (Signed)
Medical screening examination/treatment/procedure(s) were conducted as a shared visit with non-physician practitioner(s) and myself.  I personally evaluated the patient during the encounter  Doug Sou, MD 07/24/12 [phone]

## 2012-08-03 ENCOUNTER — Ambulatory Visit (INDEPENDENT_AMBULATORY_CARE_PROVIDER_SITE_OTHER): Payer: Medicare Other | Admitting: Emergency Medicine

## 2012-08-03 VITALS — BP 138/72 | HR 60 | Temp 98.6°F | Resp 18 | Ht 65.5 in | Wt 171.0 lb

## 2012-08-03 DIAGNOSIS — L0291 Cutaneous abscess, unspecified: Secondary | ICD-10-CM

## 2012-08-03 DIAGNOSIS — L0390 Cellulitis, unspecified: Secondary | ICD-10-CM

## 2012-08-03 MED ORDER — DOXYCYCLINE HYCLATE 100 MG PO CAPS: 100.0000 mg | ORAL_CAPSULE | Freq: Two times a day (BID) | ORAL | Status: DC

## 2012-08-03 NOTE — Patient Instructions (Addendum)
Keep elevated Follow up tomorrow

## 2012-08-03 NOTE — Progress Notes (Signed)
[facility] [address], [place] [address] [phone]  Date:  08/03/2012   Name:  Kristy Valencia   DOB:  [birth year]   MRN:  [id number]  PCP:  Elvina Sidle, MD    Chief Complaint: No chief complaint on file.   History of Present Illness:  Kristy Valencia is a 79 y.o. very pleasant female patient who presents with the following:  Injured 11 days ago when her cane broke while she was ascending a single step.  She fell and lacerated both legs.  She was treated at [facility] ER and has been non compliant with care of her lacerations.  The wounds are draining now and the skin flap in the left leg has not stuck down.  She has pain in both legs with no fever or chills.  No rash.  No improvement with over the counter medications or other home remedies. Denies other complaint or health concern today.   Patient Active Problem List   Diagnosis Date Noted  . Macular degeneration, left eye 08/13/2011  . Intermittent memory loss 08/13/2011  . Unsteady gait 08/13/2011  . PAF (paroxysmal atrial fibrillation) 09/18/2010    Past Medical History  Diagnosis Date  . Palpitations   . Abdominal pain     LOWER  . PAF (paroxysmal atrial fibrillation)   . Dysuria   . TB (tuberculosis)   . OA (osteoarthritis)   . GERD (gastroesophageal reflux disease)   . Hypertension   . Macular degeneration   . Compression fracture of L1 lumbar vertebra   . Chest tightness   . VT (ventricular tachycardia)   . Chronic anticoagulation     Xarelto  . Cataract     Past Surgical History  Procedure Laterality Date  . Transthoracic echocardiogram  08/24/10    Left ventricle with wall thickness demonstrating moderate LVH.  Systolic function was decreased with   an estimated ejection fraction of 65-70%.  Pulmonary artery peak  pressure of 34 mmHg  . Chest x-ray  08/21/2010    Cardiomegaly with mild edema  . Breast biopsy      X2  . Appendectomy    . Abdominal hysterectomy      History   Substance Use Topics  . Smoking status: Never Smoker   . Smokeless tobacco: Not on file  . Alcohol Use: No    Family History  Problem Relation Age of Onset  . Stomach cancer Father     Allergies  Allergen Reactions  . Codeine Nausea And Vomiting and Other (See Comments)    Neurological problems-dizziness, altered mental state  . Procardia (Nifedipine) Other (See Comments)    CARDIAC ARREST  . Penicillins Other (See Comments)    Unknown   . Sulfa Drugs Cross Reactors Other (See Comments)    Unknown   . Azithromycin Hives, Itching and Rash    ALL MYCINS    Medication list has been reviewed and updated.  Current Outpatient Prescriptions on File Prior to Visit  Medication Sig Dispense Refill  . aspirin 81 MG tablet Take 81 mg by mouth daily.      . furosemide (LASIX) 20 MG tablet Take 20 mg by mouth daily.        No current facility-administered medications on file prior to visit.    Review of Systems:  As per HPI, otherwise negative.    Physical Examination: Filed Vitals:   08/03/12 1425  BP: 138/72  Pulse: 60  Temp: 98.6 F (37 C)  Resp: 18   Filed Vitals:   08/03/12 1425  Height: 5' 5.5" (1.664 m)  Weight: 171 lb (77.565 kg)   Body mass index is 28.01 kg/(m^2). Ideal Body Weight: Weight in (lb) to have BMI = 25: 152.2   GEN: WDWN, NAD, Non-toxic, Alert & Oriented x 3 HEENT: Atraumatic, Normocephalic.  Ears and Nose: No external deformity. EXTR: No clubbing/cyanosis/edema NEURO: Normal gait.  PSYCH: Normally interactive. Conversant. Not depressed or anxious appearing.  Calm demeanor.  LEGS:  Large area of bullae from skin tear that did not stick down left shin.  Laceration right shin that did not heal.  Assessment and Plan: Sutures removed Wounds dressed Septra Cellulitis Follow up tomorrow   Signed,  Phillips Odor, MD

## 2012-08-04 ENCOUNTER — Ambulatory Visit (INDEPENDENT_AMBULATORY_CARE_PROVIDER_SITE_OTHER): Payer: Medicare Other | Admitting: Emergency Medicine

## 2012-08-04 VITALS — BP 140/76 | HR 80 | Temp 98.5°F | Resp 18 | Wt 169.0 lb

## 2012-08-04 DIAGNOSIS — L0291 Cutaneous abscess, unspecified: Secondary | ICD-10-CM

## 2012-08-04 DIAGNOSIS — L0390 Cellulitis, unspecified: Secondary | ICD-10-CM

## 2012-08-04 NOTE — Patient Instructions (Addendum)

## 2012-08-04 NOTE — Progress Notes (Signed)
[facility] [address] [place] [address] [phone]  Date:  08/04/2012   Name:  Kristy Valencia   DOB:  1920-01-03   MRN:  [id number]  PCP:  Elvina Sidle, MD    Chief Complaint: Follow-up   History of Present Illness:  Kristy Valencia is a 79 y.o. very pleasant female patient who presents with the following:  Seen yesterday for wound infection.  Interval improvement.  No fever or chills.  Minimal drainage.  Less pain.  Denies other complaint or health concern today.   Patient Active Problem List   Diagnosis Date Noted  . Macular degeneration, left eye 08/13/2011  . Intermittent memory loss 08/13/2011  . Unsteady gait 08/13/2011  . PAF (paroxysmal atrial fibrillation) 09/18/2010    Past Medical History  Diagnosis Date  . Palpitations   . Abdominal pain     LOWER  . PAF (paroxysmal atrial fibrillation)   . Dysuria   . TB (tuberculosis)   . OA (osteoarthritis)   . GERD (gastroesophageal reflux disease)   . Hypertension   . Macular degeneration   . Compression fracture of L1 lumbar vertebra   . Chest tightness   . VT (ventricular tachycardia)   . Chronic anticoagulation     Xarelto  . Cataract     Past Surgical History  Procedure Laterality Date  . Transthoracic echocardiogram  08/24/10    Left ventricle with wall thickness demonstrating moderate LVH.  Systolic function was decreased with   an estimated ejection fraction of 65-70%.  Pulmonary artery peak  pressure of 34 mmHg  . Chest x-ray  08/21/2010    Cardiomegaly with mild edema  . Breast biopsy      X2  . Appendectomy    . Abdominal hysterectomy      History  Substance Use Topics  . Smoking status: Never Smoker   . Smokeless tobacco: Not on file  . Alcohol Use: No    Family History  Problem Relation Age of Onset  . Stomach cancer Father     Allergies  Allergen Reactions  . Codeine Nausea And Vomiting and Other (See Comments)    Neurological problems-dizziness,  altered mental state  . Procardia (Nifedipine) Other (See Comments)    CARDIAC ARREST  . Penicillins Other (See Comments)    Unknown   . Sulfa Drugs Cross Reactors Other (See Comments)    Unknown   . Azithromycin Hives, Itching and Rash    ALL MYCINS    Medication list has been reviewed and updated.  Current Outpatient Prescriptions on File Prior to Visit  Medication Sig Dispense Refill  . aspirin 81 MG tablet Take 81 mg by mouth daily.      Marland Kitchen doxycycline (VIBRAMYCIN) 100 MG capsule Take 1 capsule (100 mg total) by mouth 2 (two) times daily.  20 capsule  0  . furosemide (LASIX) 20 MG tablet Take 20 mg by mouth daily.        No current facility-administered medications on file prior to visit.    Review of Systems:  As per HPI, otherwise negative.    Physical Examination: Filed Vitals:   08/04/12 1101  BP: 140/76  Pulse: 80  Temp: 98.5 F (36.9 C)  Resp: 18   Filed Vitals:   08/04/12 1101  Weight: 169 lb (76.658 kg)   Body mass index is 27.69 kg/(m^2). Ideal Body Weight:     GEN: WDWN, NAD, Non-toxic, Alert & Oriented x 3 HEENT: Atraumatic, Normocephalic.  Ears and Nose: No external deformity. EXTR: No clubbing/cyanosis/edema NEURO: Normal gait.  PSYCH: Normally interactive. Conversant. Not depressed or anxious appearing.  Calm demeanor.  Wounds have minimal drainage.  Less swelling and erythema.    Assessment and Plan: Cellulitis wounds legs Follow up Tuesday Daily dressing changes Continue antibiotics   Signed,  Phillips Odor, MD

## 2012-08-08 ENCOUNTER — Ambulatory Visit (INDEPENDENT_AMBULATORY_CARE_PROVIDER_SITE_OTHER): Payer: Medicare Other | Admitting: Emergency Medicine

## 2012-08-08 VITALS — BP 130/72 | HR 82 | Temp 99.0°F | Resp 17 | Ht 65.5 in | Wt 167.0 lb

## 2012-08-08 DIAGNOSIS — L0291 Cutaneous abscess, unspecified: Secondary | ICD-10-CM

## 2012-08-08 DIAGNOSIS — L0390 Cellulitis, unspecified: Secondary | ICD-10-CM

## 2012-08-08 DIAGNOSIS — R609 Edema, unspecified: Secondary | ICD-10-CM

## 2012-08-08 NOTE — Patient Instructions (Addendum)
eEdema Edema is an abnormal build-up of fluids in tissues. Because this is partly dependent on gravity (water flows to the lowest place), it is more common in the legs and thighs (lower extremities). It is also common in the looser tissues, like around the eyes. Painless swelling of the feet and ankles is common and increases as a person ages. It may affect both legs and may include the calves or even thighs. When squeezed, the fluid may move out of the affected area and may leave a dent for a few moments. CAUSES   Prolonged standing or sitting in one place for extended periods of time. Movement helps pump tissue fluid into the veins, and absence of movement prevents this, resulting in edema.  Varicose veins. The valves in the veins do not work as well as they should. This causes fluid to leak into the tissues.  Fluid and salt overload.  Injury, burn, or surgery to the leg, ankle, or foot, may damage veins and allow fluid to leak out.  Sunburn damages vessels. Leaky vessels allow fluid to go out into the sunburned tissues.  Allergies (from insect bites or stings, medications or chemicals) cause swelling by allowing vessels to become leaky.  Protein in the blood helps keep fluid in your vessels. Low protein, as in malnutrition, allows fluid to leak out.  Hormonal changes, including pregnancy and menstruation, cause fluid retention. This fluid may leak out of vessels and cause edema.  Medications that cause fluid retention. Examples are sex hormones, blood pressure medications, steroid treatment, or anti-depressants.  Some illnesses cause edema, especially heart failure, kidney disease, or liver disease.  Surgery that cuts veins or lymph nodes, such as surgery done for the heart or for breast cancer, may result in edema. DIAGNOSIS  Your caregiver is usually easily able to determine what is causing your swelling (edema) by simply asking what is wrong (getting a history) and examining you  (doing a physical). Sometimes x-rays, EKG (electrocardiogram or heart tracing), and blood work may be done to evaluate for underlying medical illness. TREATMENT  General treatment includes:  Leg elevation (or elevation of the affected body part).  Restriction of fluid intake.  Prevention of fluid overload.  Compression of the affected body part. Compression with elastic bandages or support stockings squeezes the tissues, preventing fluid from entering and forcing it back into the blood vessels.  Diuretics (also called water pills or fluid pills) pull fluid out of your body in the form of increased urination. These are effective in reducing the swelling, but can have side effects and must be used only under your caregiver's supervision. Diuretics are appropriate only for some types of edema. The specific treatment can be directed at any underlying causes discovered. Heart, liver, or kidney disease should be treated appropriately. HOME CARE INSTRUCTIONS   Elevate the legs (or affected body part) above the level of the heart, while lying down.  Avoid sitting or standing still for prolonged periods of time.  Avoid putting anything directly under the knees when lying down, and do not wear constricting clothing or garters on the upper legs.  Exercising the legs causes the fluid to work back into the veins and lymphatic channels. This may help the swelling go down.  The pressure applied by elastic bandages or support stockings can help reduce ankle swelling.  A low-salt diet may help reduce fluid retention and decrease the ankle swelling.  Take any medications exactly as prescribed. SEEK MEDICAL CARE IF:  Your edema is  not responding to recommended treatments. SEEK IMMEDIATE MEDICAL CARE IF:   You develop shortness of breath or chest pain.  You cannot breathe when you lay down; or if, while lying down, you have to get up and go to the window to get your breath.  You are having  increasing swelling without relief from treatment.  You develop a fever over 102 F (38.9 C).  You develop pain or redness in the areas that are swollen.  Tell your caregiver right away if you have gained 3 lb/1.4 kg in 1 day or 5 lb/2.3 kg in a week. MAKE SURE YOU:   Understand these instructions.  Will watch your condition.  Will get help right away if you are not doing well or get worse. Document Released: 03/22/2005 Document Revised: 09/21/2011 Document Reviewed: 11/08/2007 [facility] Patient Information 2013 [place], [place].

## 2012-08-08 NOTE — Progress Notes (Signed)
[facility] [address], [place] [address] [phone]  Date:  08/08/2012   Name:  Kristy Valencia   DOB:  [birth year]   MRN:  [id number]  PCP:  Elvina Sidle, MD    Chief Complaint: Follow-up   History of Present Illness:  Kristy Valencia is a 79 y.o. very pleasant female patient who presents with the following:  Follow up for bilateral lower extremity wounds.  Has continued drainage.  No fever or chills.  Moderate edema worse on right.  No improvement with over the counter medications or other home remedies. Denies other complaint or health concern today.   Patient Active Problem List   Diagnosis Date Noted  . Macular degeneration, left eye 08/13/2011  . Intermittent memory loss 08/13/2011  . Unsteady gait 08/13/2011  . PAF (paroxysmal atrial fibrillation) 09/18/2010    Past Medical History  Diagnosis Date  . Palpitations   . Abdominal pain     LOWER  . PAF (paroxysmal atrial fibrillation)   . Dysuria   . TB (tuberculosis)   . OA (osteoarthritis)   . GERD (gastroesophageal reflux disease)   . Hypertension   . Macular degeneration   . Compression fracture of L1 lumbar vertebra   . Chest tightness   . VT (ventricular tachycardia)   . Chronic anticoagulation     Xarelto  . Cataract     Past Surgical History  Procedure Laterality Date  . Transthoracic echocardiogram  08/24/10    Left ventricle with wall thickness demonstrating moderate LVH.  Systolic function was decreased with   an estimated ejection fraction of 65-70%.  Pulmonary artery peak  pressure of 34 mmHg  . Chest x-ray  08/21/2010    Cardiomegaly with mild edema  . Breast biopsy      X2  . Appendectomy    . Abdominal hysterectomy      History  Substance Use Topics  . Smoking status: Never Smoker   . Smokeless tobacco: Not on file  . Alcohol Use: No    Family History  Problem Relation Age of Onset  . Stomach cancer Father     Allergies  Allergen Reactions  .  Codeine Nausea And Vomiting and Other (See Comments)    Neurological problems-dizziness, altered mental state  . Procardia (Nifedipine) Other (See Comments)    CARDIAC ARREST  . Penicillins Other (See Comments)    Unknown   . Sulfa Drugs Cross Reactors Other (See Comments)    Unknown   . Azithromycin Hives, Itching and Rash    ALL MYCINS    Medication list has been reviewed and updated.  Current Outpatient Prescriptions on File Prior to Visit  Medication Sig Dispense Refill  . aspirin 81 MG tablet Take 81 mg by mouth daily.      Marland Kitchen doxycycline (VIBRAMYCIN) 100 MG capsule Take 1 capsule (100 mg total) by mouth 2 (two) times daily.  20 capsule  0  . furosemide (LASIX) 20 MG tablet Take 20 mg by mouth daily.        No current facility-administered medications on file prior to visit.    Review of Systems:  As per HPI, otherwise negative.    Physical Examination: Filed Vitals:   08/08/12 1434  BP: 130/72  Pulse: 82  Temp: 99 F (37.2 C)  Resp: 17   Filed Vitals:   08/08/12 1434  Height: 5' 5.5" (1.664 m)  Weight: 167 lb (75.751 kg)   Body mass index is 27.36  kg/(m^2). Ideal Body Weight: Weight in (lb) to have BMI = 25: 152.2   GEN: WDWN, NAD, Non-toxic, Alert & Oriented x 3 HEENT: Atraumatic, Normocephalic.  Ears and Nose: No external deformity. EXTR: No clubbing/cyanosis/  Moderate edema lower legs NEURO: Normal gait.  PSYCH: Normally interactive. Conversant. Not depressed or anxious appearing.  Calm demeanor.  Wounds draining.  No visible infection  Assessment and Plan: Dressing changes Peripheral edema Increase lasix to 40 a day Follow up Friday  Signed,  Phillips Odor, MD

## 2012-08-11 ENCOUNTER — Ambulatory Visit (INDEPENDENT_AMBULATORY_CARE_PROVIDER_SITE_OTHER): Payer: Medicare Other | Admitting: Emergency Medicine

## 2012-08-11 VITALS — BP 140/78 | HR 90 | Temp 97.6°F | Resp 18 | Ht 65.5 in | Wt 163.0 lb

## 2012-08-11 DIAGNOSIS — R609 Edema, unspecified: Secondary | ICD-10-CM

## 2012-08-11 DIAGNOSIS — L0390 Cellulitis, unspecified: Secondary | ICD-10-CM

## 2012-08-11 NOTE — Progress Notes (Signed)
[facility] [address], [place] [address] [phone]  Date:  08/11/2012   Name:  Kristy Valencia   DOB:  [birth year]   MRN:  [id number]  PCP:  Elvina Sidle, MD    Chief Complaint: Follow-up   History of Present Illness:  Kristy Valencia is a 79 y.o. very pleasant female patient who presents with the following:  Follow up for wound dehiscence and repair of flap laceration left leg complicated by cellulitis.  Little drainage.  No fever or chills.  No nausea or vomiting.  Denies other complaint or health concern today.   Patient Active Problem List   Diagnosis Date Noted  . Macular degeneration, left eye 08/13/2011  . Intermittent memory loss 08/13/2011  . Unsteady gait 08/13/2011  . PAF (paroxysmal atrial fibrillation) 09/18/2010    Past Medical History  Diagnosis Date  . Palpitations   . Abdominal pain     LOWER  . PAF (paroxysmal atrial fibrillation)   . Dysuria   . TB (tuberculosis)   . OA (osteoarthritis)   . GERD (gastroesophageal reflux disease)   . Hypertension   . Macular degeneration   . Compression fracture of L1 lumbar vertebra   . Chest tightness   . VT (ventricular tachycardia)   . Chronic anticoagulation     Xarelto  . Cataract     Past Surgical History  Procedure Laterality Date  . Transthoracic echocardiogram  08/24/10    Left ventricle with wall thickness demonstrating moderate LVH.  Systolic function was decreased with   an estimated ejection fraction of 65-70%.  Pulmonary artery peak  pressure of 34 mmHg  . Chest x-ray  08/21/2010    Cardiomegaly with mild edema  . Breast biopsy      X2  . Appendectomy    . Abdominal hysterectomy      History  Substance Use Topics  . Smoking status: Never Smoker   . Smokeless tobacco: Not on file  . Alcohol Use: No    Family History  Problem Relation Age of Onset  . Stomach cancer Father     Allergies  Allergen Reactions  . Codeine Nausea And Vomiting and Other (See  Comments)    Neurological problems-dizziness, altered mental state  . Procardia (Nifedipine) Other (See Comments)    CARDIAC ARREST  . Penicillins Other (See Comments)    Unknown   . Sulfa Drugs Cross Reactors Other (See Comments)    Unknown   . Azithromycin Hives, Itching and Rash    ALL MYCINS    Medication list has been reviewed and updated.  Current Outpatient Prescriptions on File Prior to Visit  Medication Sig Dispense Refill  . aspirin 81 MG tablet Take 81 mg by mouth daily.      Marland Kitchen doxycycline (VIBRAMYCIN) 100 MG capsule Take 1 capsule (100 mg total) by mouth 2 (two) times daily.  20 capsule  0  . furosemide (LASIX) 20 MG tablet Take 20 mg by mouth daily.        No current facility-administered medications on file prior to visit.    Review of Systems:  As per HPI, otherwise negative.    Physical Examination: Filed Vitals:   08/11/12 1025  BP: 140/78  Pulse: 90  Temp: 97.6 F (36.4 C)  Resp: 18   Filed Vitals:   08/11/12 1025  Height: 5' 5.5" (1.664 m)  Weight: 163 lb (73.936 kg)   Body mass index is 26.7 kg/(m^2). Ideal Body Weight: Weight  in (lb) to have BMI = 25: 152.2   GEN: WDWN, NAD, Non-toxic, Alert & Oriented x 3 HEENT: Atraumatic, Normocephalic.  Ears and Nose: No external deformity. EXTR: No clubbing/cyanosis/edema NEURO: Normal gait.  PSYCH: Normally interactive. Conversant. Not depressed or anxious appearing.  Calm demeanor.  Wounds drying up.  Little drainage cellulitis improving.  Assessment and Plan: Follow up in one week Continue dressing changes   Signed,  Phillips Odor, MD

## 2012-08-18 ENCOUNTER — Ambulatory Visit (INDEPENDENT_AMBULATORY_CARE_PROVIDER_SITE_OTHER): Payer: Medicare Other | Admitting: Physician Assistant

## 2012-08-18 VITALS — BP 138/76 | HR 62 | Temp 97.4°F | Resp 16

## 2012-08-18 DIAGNOSIS — R609 Edema, unspecified: Secondary | ICD-10-CM

## 2012-08-18 DIAGNOSIS — L0291 Cutaneous abscess, unspecified: Secondary | ICD-10-CM

## 2012-08-18 DIAGNOSIS — L0390 Cellulitis, unspecified: Secondary | ICD-10-CM

## 2012-08-18 NOTE — Progress Notes (Signed)
Patient ID: Kristy Valencia MRN: [id number], DOB: 1919-11-04, 79 y.o. Date of Encounter: 08/18/2012, 11:40 AM  Primary Physician: Elvina Sidle, MD  Chief Complaint: Follow up cellulitis and edema  HPI: 79 y.o. female with history below presents for follow up cellulitis and edema of the bilateral distal lower extremities. See previous office visits. Reports continued improvement of edema and erythema. Patient's daughter has been performing daily dressing changes with Xeroform. Little to no drainage. She has not been elevating the the legs. Afebrile. No chills. No other complaints today. She does have an appointment with her PCP on 08/21/12.    Past Medical History  Diagnosis Date  . Palpitations   . Abdominal pain     LOWER  . PAF (paroxysmal atrial fibrillation)   . Dysuria   . TB (tuberculosis)   . OA (osteoarthritis)   . GERD (gastroesophageal reflux disease)   . Hypertension   . Macular degeneration   . Compression fracture of L1 lumbar vertebra   . Chest tightness   . VT (ventricular tachycardia)   . Chronic anticoagulation     Xarelto  . Cataract      Home Meds: Prior to Admission medications   Medication Sig Start Date End Date Taking? Authorizing Provider  aspirin 81 MG tablet Take 81 mg by mouth daily.    Historical Provider, MD  doxycycline (VIBRAMYCIN) 100 MG capsule Take 1 capsule (100 mg total) by mouth 2 (two) times daily. 08/03/12   Phillips Odor, MD  furosemide (LASIX) 20 MG tablet Take 20 mg by mouth daily.     Historical Provider, MD  vitamin C (ASCORBIC ACID) 500 MG tablet Take 500 mg by mouth daily.    Historical Provider, MD  zinc gluconate 50 MG tablet Take 50 mg by mouth daily.    Historical Provider, MD    Allergies:  Allergies  Allergen Reactions  . Codeine Nausea And Vomiting and Other (See Comments)    Neurological problems-dizziness, altered mental state  . Procardia (Nifedipine) Other (See Comments)    CARDIAC ARREST  . Penicillins Other  (See Comments)    Unknown   . Sulfa Drugs Cross Reactors Other (See Comments)    Unknown   . Azithromycin Hives, Itching and Rash    ALL MYCINS    History   Social History  . Marital Status: Widowed    Spouse Name: N/A    Number of Children: N/A  . Years of Education: N/A   Occupational History  . Not on file.   Social History Main Topics  . Smoking status: Never Smoker   . Smokeless tobacco: Not on file  . Alcohol Use: No  . Drug Use: No  . Sexually Active: No   Other Topics Concern  . Not on file   Social History Narrative  . No narrative on file     Review of Systems: Constitutional: negative for chills, fever, night sweats, weight changes, or fatigue  Cardiovascular: negative for chest pain or palpitations Respiratory: negative for wheezing, shortness of breath, or cough Dermatological: see above   Physical Exam: Blood pressure 138/76, pulse 62, temperature 97.4 F (36.3 C), temperature source Oral, resp. rate 16., There is no weight on file to calculate BMI. General: Well developed, well nourished, in no acute distress. Head: Normocephalic, atraumatic, eyes without discharge, sclera non-icteric, nares are without discharge.   Neck: Supple. Full ROM.  Lungs: Breathing is unlabored. Heart: Regular rate. Msk:  Strength and tone normal for age. Extremities/Skin: Warm and  dry. No clubbing or cyanosis. Dr. Dareen Piano was brought into the room for the physical examination as he has been following the patient and he could provide insight as to the progression of the lower extremities. Bilateral lower extremities with non pitting edema. Mild edema, left greater than right. Well healing wounds bilateral lower shins. Eschar in place left wound.  Neuro: Alert and oriented X 3. Moves all extremities spontaneously. Gait is with a cane. CNII-XII grossly in tact. Psych:  Responds to questions appropriately with a normal affect.     ASSESSMENT AND PLAN:  79 y.o. female  with cellulitis and edema of the bilateral lower extremities -Wound care -Continue daily dressing changes -Has appointment with Dr. Milus Glazier on 08/21/12, discuss further treatment options with him at that time -Until then fluid restrict to 2 L per day -Limit salt intake -Elevate legs -PCP consider echo and BNP   Signed, Eula Listen, PA-C 08/18/2012 11:40 AM

## 2012-08-21 ENCOUNTER — Encounter: Payer: Self-pay | Admitting: Family Medicine

## 2012-08-21 ENCOUNTER — Ambulatory Visit (INDEPENDENT_AMBULATORY_CARE_PROVIDER_SITE_OTHER): Payer: Medicare Other | Admitting: Family Medicine

## 2012-08-21 VITALS — BP 136/60 | HR 56 | Temp 98.6°F | Resp 16 | Ht 67.0 in | Wt 168.2 lb

## 2012-08-21 DIAGNOSIS — Z5189 Encounter for other specified aftercare: Secondary | ICD-10-CM

## 2012-08-21 DIAGNOSIS — R609 Edema, unspecified: Secondary | ICD-10-CM

## 2012-08-21 DIAGNOSIS — R358 Other polyuria: Secondary | ICD-10-CM

## 2012-08-21 DIAGNOSIS — S81002D Unspecified open wound, left knee, subsequent encounter: Secondary | ICD-10-CM

## 2012-08-21 DIAGNOSIS — R3589 Other polyuria: Secondary | ICD-10-CM

## 2012-08-21 MED ORDER — MUPIROCIN CALCIUM 2 % EX CREA: TOPICAL_CREAM | Freq: Every day | CUTANEOUS | Status: DC

## 2012-08-21 MED ORDER — CIPROFLOXACIN HCL 250 MG PO TABS: 250.0000 mg | ORAL_TABLET | Freq: Two times a day (BID) | ORAL | Status: DC

## 2012-08-21 MED ORDER — TORSEMIDE 20 MG PO TABS: 20.0000 mg | ORAL_TABLET | Freq: Every day | ORAL | Status: DC

## 2012-08-21 NOTE — Progress Notes (Signed)
Is a 79 year old woman who is coming by her daughter, who comes in today after having aluminum Keflex Ina Kick cane broke one month ago. She was evaluated in the emergency room on April 20, given a tetanus shot, and had sutures placed. Nevertheless, patient has not been able to stop picking at her wound, this touches fell out leaving her with a large exposed area of adipose tissue on her left leg and knee smaller laceration on the right leg.  Patient's wounds are complicated by the fact that she has chronic edema. She's been asked to increase the Lasix from 20-40 mg a day which has not made any difference.  Patient has been receiving wound care weekly since the incident with no significant improvement. In fact she has had some cellulitis in the area which has been treated with doxycycline and there has been reduction in the erythema surrounding the wounds. She's having no fever or significant leg pain.  Objective: Elderly woman in no acute distress, accompanied by her daughter. Patient is using a metal cane for assistance in gait stability.  The right leg has a laceration which measures 1" x 0.5" in the anterior lower shin with mild induration on the wound edges, no erythema and exposed adipose tissue. The leg on the left has a laceration which measures 3" x 0.5" at the anterior lower shin with minimal induration, minimal erythema surrounding the wound, and a large 1 x 1" black and eschar over the central part of the exposed subcutaneous tissue.  There is bilateral marked 3+ edema up to the knees.  Assessment: Wounds are clean today but they have not shown any significant healing in a month. I really think she should be seen in the wound center, but patient and daughter would like to see if reducing the edema and trying a different cream will help. Since patient is allergic to sulfa, Bactroban seems like the best choice of a cream.  The patient is unable to supply [place] with a urine to evaluate her  polyuria symptoms. Because of this, and in the interest of being practical, we will assume that patient has a urinary infection which is mild.  Plan: Bactroban cream daily, keep wound exposed areas much as possible, cover wound plan patient active or ambulatory, asked for home health care nurse to come in and supervised dressing changes, followup one week. I have suggested that patient reconsider the use of pinch grafts to jump start wound closure. In addition we will switch from Lasix to Demadex 20 mg daily to try to reduce some of the swelling.  Cipro 250 twice a day x3 days for possible UTI  Signed, Elvina Sidle, M.D.

## 2012-08-29 ENCOUNTER — Telehealth: Payer: Self-pay

## 2012-08-29 NOTE — Telephone Encounter (Signed)
Ok to change to meta, honey,  [employer]

## 2012-08-29 NOTE — Telephone Encounter (Signed)
Will you consider changing to metahoney bandages, please advise and I can call in the order, the metahoney bandage would be changed every other day, wound care nurse thinks this will help the area heal.

## 2012-08-29 NOTE — Telephone Encounter (Signed)
ROBIN WOULD LIKE TO KNOW IF SHE CAN CHANGE PT'S WOUND CARE DRESSING. PLEASE CALL [phone] SHE IS A NURSE AT [facility]

## 2012-08-30 NOTE — Telephone Encounter (Signed)
Called robin to advise.

## 2012-08-31 ENCOUNTER — Ambulatory Visit (INDEPENDENT_AMBULATORY_CARE_PROVIDER_SITE_OTHER): Payer: Medicare Other | Admitting: Family Medicine

## 2012-08-31 ENCOUNTER — Encounter: Payer: Self-pay | Admitting: Family Medicine

## 2012-08-31 VITALS — HR 63 | Temp 98.2°F | Resp 16

## 2012-08-31 DIAGNOSIS — S81802D Unspecified open wound, left lower leg, subsequent encounter: Secondary | ICD-10-CM

## 2012-08-31 DIAGNOSIS — M79609 Pain in unspecified limb: Secondary | ICD-10-CM

## 2012-08-31 DIAGNOSIS — Z5189 Encounter for other specified aftercare: Secondary | ICD-10-CM

## 2012-08-31 NOTE — Progress Notes (Signed)
80 yo woman with chronic ulcers following wound repair at the ED 39 days ago.    Objective: NAD 2-3+ edema bilaterally Less erythema Yellow watery eschar left anterior shin.  No black eschar now.  The margins look healthy Closing right wound, now 5 mm x 1 cm.  Assessment:  Improving.  She will be alternating the bactroban with metahoney.  Plan:  followup next Tuesday.  Kristy Valencia

## 2012-09-05 ENCOUNTER — Ambulatory Visit (INDEPENDENT_AMBULATORY_CARE_PROVIDER_SITE_OTHER): Payer: Medicare Other | Admitting: Emergency Medicine

## 2012-09-05 VITALS — BP 140/86 | HR 63 | Temp 97.9°F | Resp 16

## 2012-09-05 DIAGNOSIS — S81802D Unspecified open wound, left lower leg, subsequent encounter: Secondary | ICD-10-CM

## 2012-09-05 DIAGNOSIS — R609 Edema, unspecified: Secondary | ICD-10-CM

## 2012-09-05 DIAGNOSIS — Z5189 Encounter for other specified aftercare: Secondary | ICD-10-CM

## 2012-09-05 NOTE — Progress Notes (Signed)
[facility] [address], [place] [address] [phone]  Date:  09/05/2012   Name:  Kristy Valencia   DOB:  02-08-1920   MRN:  [id number]  PCP:  Elvina Sidle, MD    Chief Complaint: Wound Check   History of Present Illness:  Kristy Valencia is a 79 y.o. very pleasant female patient who presents with the following:  Continues wound care.  Right leg lesion nearly covered and left remains wide open. Has some eschar but no granulation.  No fever or chills.    Patient Active Problem List   Diagnosis Date Noted  . Macular degeneration, left eye 08/13/2011  . Intermittent memory loss 08/13/2011  . Unsteady gait 08/13/2011  . PAF (paroxysmal atrial fibrillation) 09/18/2010    Past Medical History  Diagnosis Date  . Palpitations   . Abdominal pain     LOWER  . PAF (paroxysmal atrial fibrillation)   . Dysuria   . TB (tuberculosis)   . OA (osteoarthritis)   . GERD (gastroesophageal reflux disease)   . Hypertension   . Macular degeneration   . Compression fracture of L1 lumbar vertebra   . Chest tightness   . VT (ventricular tachycardia)   . Chronic anticoagulation     Xarelto  . Cataract     Past Surgical History  Procedure Laterality Date  . Transthoracic echocardiogram  08/24/10    Left ventricle with wall thickness demonstrating moderate LVH.  Systolic function was decreased with   an estimated ejection fraction of 65-70%.  Pulmonary artery peak  pressure of 34 mmHg  . Chest x-ray  08/21/2010    Cardiomegaly with mild edema  . Breast biopsy      X2  . Appendectomy    . Abdominal hysterectomy      History  Substance Use Topics  . Smoking status: Never Smoker   . Smokeless tobacco: Not on file  . Alcohol Use: No    Family History  Problem Relation Age of Onset  . Stomach cancer Father     Allergies  Allergen Reactions  . Codeine Nausea And Vomiting and Other (See Comments)    Neurological problems-dizziness, altered mental  state  . Procardia (Nifedipine) Other (See Comments)    CARDIAC ARREST  . Penicillins Other (See Comments)    Unknown   . Sulfa Drugs Cross Reactors Other (See Comments)    Unknown   . Azithromycin Hives, Itching and Rash    ALL MYCINS    Medication list has been reviewed and updated.  Current Outpatient Prescriptions on File Prior to Visit  Medication Sig Dispense Refill  . aspirin 81 MG tablet Take 81 mg by mouth daily.      . ciprofloxacin (CIPRO) 250 MG tablet Take 1 tablet (250 mg total) by mouth 2 (two) times daily.  6 tablet  0  . doxycycline (VIBRAMYCIN) 100 MG capsule Take 1 capsule (100 mg total) by mouth 2 (two) times daily.  20 capsule  0  . mupirocin cream (BACTROBAN) 2 % Apply topically daily.  30 g  3  . potassium chloride SA (K-DUR,KLOR-CON) 20 MEQ tablet Take 20 mEq by mouth daily.      Marland Kitchen torsemide (DEMADEX) 20 MG tablet Take 1 tablet (20 mg total) by mouth daily.  30 tablet  5  . vitamin C (ASCORBIC ACID) 500 MG tablet Take 500 mg by mouth daily.      Marland Kitchen zinc gluconate 50 MG tablet Take 50 mg  by mouth daily.       No current facility-administered medications on file prior to visit.    Review of Systems:  As per HPI, otherwise negative.    Physical Examination: Filed Vitals:   09/05/12 1434  BP: 140/86  Pulse: 63  Temp: 97.9 F (36.6 C)  Resp: 16   There were no vitals filed for this visit. There is no weight on file to calculate BMI. Ideal Body Weight:     GEN: WDWN, NAD, Non-toxic, Alert & Oriented x 3 HEENT: Atraumatic, Normocephalic.  Ears and Nose: No external deformity. EXTR: No clubbing/cyanosis/edema NEURO: Normal gait.  PSYCH: Normally interactive. Conversant. Not depressed or anxious appearing.  Calm demeanor.  Right leg improving with wound nearly filled in  LEFT leg:  Still open and weeping.  Assessment and Plan: Non healing wound left leg Wound center   Signed,  Phillips Odor, MD

## 2012-09-05 NOTE — Addendum Note (Signed)
Addended by: Carmelina Dane on: 09/05/2012 03:06 PM   Modules accepted: Orders

## 2012-09-25 ENCOUNTER — Encounter (HOSPITAL_BASED_OUTPATIENT_CLINIC_OR_DEPARTMENT_OTHER): Payer: Medicare Other | Attending: Plastic Surgery

## 2012-09-25 DIAGNOSIS — L97809 Non-pressure chronic ulcer of other part of unspecified lower leg with unspecified severity: Secondary | ICD-10-CM | POA: Insufficient documentation

## 2012-09-25 DIAGNOSIS — I8390 Asymptomatic varicose veins of unspecified lower extremity: Secondary | ICD-10-CM | POA: Insufficient documentation

## 2012-09-25 DIAGNOSIS — I872 Venous insufficiency (chronic) (peripheral): Secondary | ICD-10-CM | POA: Insufficient documentation

## 2012-09-25 DIAGNOSIS — Z7982 Long term (current) use of aspirin: Secondary | ICD-10-CM | POA: Insufficient documentation

## 2012-09-25 DIAGNOSIS — Z79899 Other long term (current) drug therapy: Secondary | ICD-10-CM | POA: Insufficient documentation

## 2012-09-25 NOTE — Progress Notes (Signed)
[facility]  NAME:  CHONTE, RICKE NO.:  [id number]  MEDICAL RECORD NO.:  [id number]      DATE OF BIRTH:  22-Nov-1919  PHYSICIAN:  Wayland Denis, DO       VISIT DATE:  09/25/2012                                  OFFICE VISIT   HISTORY:  The patient is a 79 year old female who injured her legs when she fell on the steps after her cane broke.  This occurred several months ago.  She now has venous stasis, chronic ulcers bilateral lower extremities related to trauma.  She has got good family support.  She has been putting Bactroban on the area with very little improvement.  PAST MEDICAL HISTORY:  Positive for back surgery, appendectomy, hemorrhoidectomy, abdominal hysterectomy, and lysis of adhesions of the left finger.  MEDICATIONS:  Include Demadex, Klor-Con, aspirin, zinc, vitamin C, and Bactroban.  ALLERGIES:  She is allergic to Procardia, sulfa, and mycin.  SOCIAL HISTORY:  Has good family support and she is a nonsmoker.  REVIEW OF SYSTEMS:  Otherwise negative.  PHYSICAL EXAMINATION:  GENERAL:  She is alert, oriented, and cooperative.  She seems to be a good historian.  She is pleasant.  She is aware of what happened.  Denies any other injuries related to the fall. HEENT:  Her pupils are equal.  She has arcus senilis.  Her extraocular muscles are intact.  No cervical lymphadenopathy. LUNGS:  Her breathing is unlabored. HEART:  Her heart is regular. ABDOMEN:  Her abdomen is soft. EXTREMITIES:  She has got severe venous insufficiency of the lower extremities and varicose veins.  Her pulses are palpable and regular.  Her wounds on the right is 0.5 x 1.7 x 0.1 and on the left 3 x 4.4 x 2. Significant debridement was done on the right down to muscle, removing the old hematoma, and then on the left, there was 1 area that was deeper into the subcutaneous tissue.  We will use silver alginate and Profore Lite bilaterally and we will see  her back in a week.  In the meantime, multivitamin, vitamin C, zinc, and keep her legs elevated.     Wayland Denis, DO     CS/MEDQ  D:  09/25/2012  T:  09/25/2012  Job:  [id number]

## 2012-10-03 NOTE — Progress Notes (Signed)
[facility]  NAME:  Kristy Valencia, Kristy Valencia NO.:  [id number]  MEDICAL RECORD NO.:  [id number]      DATE OF BIRTH:  Oct 24, 1919  PHYSICIAN:  Wayland Denis, DO       VISIT DATE:  10/02/2012                                  OFFICE VISIT   HISTORY:  The patient is a 79 year old female who is here for followup on her bilateral lower extremity ulcers related to venous stasis and trauma.  Overall they look much improved.  There is less swelling on both legs.  She is pleased with the progress.  There has been no change in her medications or social history.  REVIEW OF SYSTEMS:  Otherwise, negative.  PHYSICAL EXAMINATION:  On exam, she is alert, oriented, cooperative, not in any acute distress.  She is pleasant.  Pupils are equal.  Extraocular muscles are intact.  Her breathing is unlabored.  Her heart is regular. The wounds were debrided and those notes are noted in the chart.  We will continue with local dressing changes, but we will add collagen to both, and we will see her back in a week.     Wayland Denis, DO     CS/MEDQ  D:  10/02/2012  T:  10/02/2012  Job:  [id number]

## 2012-10-09 ENCOUNTER — Encounter (HOSPITAL_BASED_OUTPATIENT_CLINIC_OR_DEPARTMENT_OTHER): Payer: Medicare Other | Attending: Plastic Surgery

## 2012-10-09 DIAGNOSIS — L97809 Non-pressure chronic ulcer of other part of unspecified lower leg with unspecified severity: Secondary | ICD-10-CM | POA: Insufficient documentation

## 2012-10-17 NOTE — Progress Notes (Signed)
[facility]  NAME:  DAVI, ROTAN NO.:  [id number]  MEDICAL RECORD NO.:  [id number]      DATE OF BIRTH:  01-31-1920  PHYSICIAN:  Wayland Denis, DO       VISIT DATE:  10/16/2012                                  OFFICE VISIT   The patient is a 79 year old female with bilateral lower extremity ulcers, chronic.  She is doing much better.  Both of them are less swollen, not as big and overall improved.  She has been using Aquacel Ag.  There has been no change in her medications and social history.  On exam, she is alert, oriented.  She is pleasant.  She is not a great historian, but her family is with her and help with that.  We debrided both sides and those notes are noted in chart.  Recommend continuing with the Aquacel Ag and following up in 1 week. Also with the wrap elevation, multivitamin, vitamin C, and zinc.     Wayland Denis, DO     CS/MEDQ  D:  10/16/2012  T:  10/16/2012  Job:  [id number]

## 2012-10-24 NOTE — Progress Notes (Signed)
[facility]  NAME:  Kristy Valencia, Kristy Valencia NO.:  [id number]  MEDICAL RECORD NO.:  [id number]      DATE OF BIRTH:  [birth year]  PHYSICIAN:  Wayland Denis, DO            VISIT DATE:                                  OFFICE VISIT   The patient is a 79 year old female, who is here for followup on her bilateral lower extremity ulcers.  She is doing much better.  The right looks incredible with marked improvement.  There is only a slight bit of opening left, but the swelling is markedly improved.  The left side is improving as well with more granulation tissue and less fibrous tissue.  PHYSICAL EXAMINATION:  She is alert and oriented, cooperative, not in any acute distress.  She is pleasant.  Her caregiver is with her as well.  Her breathing is unlabored and her heart rate is regular.  We will continue with Fibracol on the right and on the left Santyl and collagen and see her back in a month.     Wayland Denis, DO     CS/MEDQ  D:  10/23/2012  T:  10/24/2012  Job:  [id number]

## 2012-11-06 ENCOUNTER — Encounter (HOSPITAL_BASED_OUTPATIENT_CLINIC_OR_DEPARTMENT_OTHER): Payer: Medicare Other | Attending: Plastic Surgery

## 2012-11-06 DIAGNOSIS — L97809 Non-pressure chronic ulcer of other part of unspecified lower leg with unspecified severity: Secondary | ICD-10-CM | POA: Insufficient documentation

## 2012-11-20 NOTE — Progress Notes (Signed)
[facility]  NAME:  Kristy Valencia, Kristy Valencia NO.:  [id number]  MEDICAL RECORD NO.:  [id number]      DATE OF BIRTH:  07/20/1919  PHYSICIAN:  Wayland Denis, DO            VISIT DATE:                                  OFFICE VISIT   The patient is a 79 year old female, who is here for followup on her bilateral lower extremity ulcers.  She is doing extremely well, has nearly healed area.  She has a little bit of eschar on both, but overall is markedly improved.  There is no change in her medications or social history.  On exam, she is alert, oriented, cooperative, not in any acute distress. She is very pleasant.  Pupils are equal.  Extraocular muscles are intact.  No cervical lymphadenopathy.  Her breathing is unlabored. Heart rate is regular.  The wounds have markedly improved.  There is extremely small little eschar.  We will continue with the Silvercel, Profore Lite, and see her back in 1 week at which time she will likely be healed.     Wayland Denis, DO     CS/MEDQ  D:  11/20/2012  T:  11/20/2012  Job:  [id number]

## 2012-11-27 NOTE — Progress Notes (Signed)
[facility]  NAME:  Kristy Valencia, Kristy Valencia NO.:  [id number]  MEDICAL RECORD NO.:  [id number]      DATE OF BIRTH:  10-27-1919  PHYSICIAN:  Wayland Denis, DO       VISIT DATE:  11/26/2012                                  OFFICE VISIT   The patient is a 79 year old female who is here for followup on her bilateral lower extremity ulcers.  She is very pleasant as usual, and has completely healed.  She does have some swelling that still has improved with the compression.  So, recommend her continuing with compressive stockings, so that she does not breakdown again, but overall she has done extremely well.  So, compression stockings and follow up as needed.     Wayland Denis, DO     CS/MEDQ  D:  11/27/2012  T:  11/27/2012  Job:  [id number]

## 2012-12-14 ENCOUNTER — Ambulatory Visit (INDEPENDENT_AMBULATORY_CARE_PROVIDER_SITE_OTHER): Payer: Medicare Other | Admitting: Family Medicine

## 2012-12-14 ENCOUNTER — Encounter: Payer: Self-pay | Admitting: Family Medicine

## 2012-12-14 VITALS — BP 124/64 | HR 62 | Temp 98.4°F | Resp 18 | Ht 66.0 in | Wt 159.0 lb

## 2012-12-14 DIAGNOSIS — IMO0002 Reserved for concepts with insufficient information to code with codable children: Secondary | ICD-10-CM

## 2012-12-14 DIAGNOSIS — S81802S Unspecified open wound, left lower leg, sequela: Secondary | ICD-10-CM

## 2012-12-14 DIAGNOSIS — R600 Localized edema: Secondary | ICD-10-CM

## 2012-12-14 DIAGNOSIS — R609 Edema, unspecified: Secondary | ICD-10-CM

## 2012-12-14 DIAGNOSIS — R413 Other amnesia: Secondary | ICD-10-CM

## 2012-12-14 NOTE — Progress Notes (Signed)
This is a 79 year old woman with pedal edema. Comes in with her daughter, Kristy Valencia, with whom she lives. The edema had resulted in a skin infection on the left mid shin.  Patient went to the wound Center and has finally cleared up the infection on the left shin.  With discussed the different ways to get the edema out of her lower leg. Patient forgets to elevate her legs and refuses when she gets home. She's taking Demadex and potassium at the current time to try to keep the swelling down. She refuses compression stockings.  Objective: To 3+ pedal edema and pretibial edema. Chest: Clear Heart: irregular no murmur Abdomen: Soft nontender Skin: Healed wound left shin which is now pink scar. Neurologically: Patient is alert and in no acute distress. She is forgetful but appropriate.  Assessment: Elderly woman with chronic edema who is resistant to the normal methods of reducing the edema.  Plan: Continue the Demadex. I have given her a couple ACE wraps to try.  Daughter will try to get her to elevate her legs more and apply the Ace wrap is twice a day.  Recheck 3 months Signed, Sheila Oats.D.

## 2013-01-29 ENCOUNTER — Other Ambulatory Visit: Payer: Self-pay | Admitting: Radiology

## 2013-01-29 MED ORDER — LORAZEPAM 1 MG PO TABS: 1.0000 mg | ORAL_TABLET | Freq: Every day | ORAL | Status: DC

## 2013-01-29 NOTE — Telephone Encounter (Signed)
Sent in meds for patient/ to help with anxiety in the evenings/ discussed with daughter Dr Milus Glazier.

## 2013-02-21 ENCOUNTER — Other Ambulatory Visit: Payer: Self-pay | Admitting: Family Medicine

## 2013-03-15 ENCOUNTER — Encounter: Payer: Self-pay | Admitting: Family Medicine

## 2013-03-15 ENCOUNTER — Ambulatory Visit (INDEPENDENT_AMBULATORY_CARE_PROVIDER_SITE_OTHER): Payer: Medicare Other | Admitting: Family Medicine

## 2013-03-15 VITALS — BP 134/65 | HR 64 | Temp 98.4°F | Resp 16 | Ht 67.0 in | Wt 161.0 lb

## 2013-03-15 DIAGNOSIS — R609 Edema, unspecified: Secondary | ICD-10-CM

## 2013-03-15 DIAGNOSIS — R600 Localized edema: Secondary | ICD-10-CM

## 2013-03-15 DIAGNOSIS — F4323 Adjustment disorder with mixed anxiety and depressed mood: Secondary | ICD-10-CM

## 2013-03-15 LAB — COMPREHENSIVE METABOLIC PANEL
ALT: 10 U/L (ref 0–35)
AST: 18 U/L (ref 0–37)
Albumin: 3.4 g/dL — ABNORMAL LOW (ref 3.5–5.2)
Alkaline Phosphatase: 94 U/L (ref 39–117)
BUN: 19 mg/dL (ref 6–23)
CO2: 29 mEq/L (ref 19–32)
Calcium: 9.5 mg/dL (ref 8.4–10.5)
Chloride: 98 mEq/L (ref 96–112)
Creat: 0.78 mg/dL (ref 0.50–1.10)
Glucose, Bld: 75 mg/dL (ref 70–99)
Potassium: 4.6 mEq/L (ref 3.5–5.3)
Sodium: 135 mEq/L (ref 135–145)
Total Bilirubin: 0.3 mg/dL (ref 0.3–1.2)
Total Protein: 6.5 g/dL (ref 6.0–8.3)

## 2013-03-15 MED ORDER — TORSEMIDE 20 MG PO TABS: 20.0000 mg | ORAL_TABLET | Freq: Once | ORAL | Status: DC

## 2013-03-15 MED ORDER — LORAZEPAM 1 MG PO TABS: 1.0000 mg | ORAL_TABLET | Freq: Every day | ORAL | Status: DC

## 2013-03-15 MED ORDER — POTASSIUM CHLORIDE CRYS ER 20 MEQ PO TBCR: 20.0000 meq | EXTENDED_RELEASE_TABLET | Freq: Every day | ORAL | Status: AC

## 2013-03-15 NOTE — Patient Instructions (Signed)
x

## 2013-03-15 NOTE — Progress Notes (Signed)
This 79 year old woman comes in for refill on her medicine because of chronic pedal edema and wound failed to heal for 8 months on her left and right legs. She went to wound care at [facility] for many weeks and the skin is finally he intact skin.  Her daughter, Gunnar Bulla, lives with her and helps take care of her. She notes that her mom has sundowning confusion every day around 4:00 and wanted to know if she could take the lorazepam at an early hour so mom did not do so agitated.  Her mom is voicing no complaints today. She has no shortness of breath or chest pain history in recent weeks. She's had no unstable gait changes although she occasionally does lose her balance. We discussed the importance of taking a short walk every day and does some exercises to keep her balance intact.  Objective: Patient is friendly and has good eye contact, is cooperative. She seen with her daughter in the room. HEENT: Unremarkable Neck: Supple no adenopathy or bruits Chest: Clear: Heart: Regular no murmur Abdomen: Soft nontender without HSM Extremities: 2+ edema bilaterally with evidence of well-healed scars in both lower extremities.  Assessment: Patient seems to be doing pretty well right now. I think we can continue the medicine safely. I've encouraged the family to work on lower extremity strength and balance.  Plan: Pedal edema - Plan: potassium chloride SA (K-DUR,KLOR-CON) 20 MEQ tablet, torsemide (DEMADEX) 20 MG tablet, Comprehensive metabolic panel  Adjustment disorder with mixed anxiety and depressed mood - Plan: LORazepam (ATIVAN) 1 MG tablet  Signed, Elvina Sidle, MD

## 2013-04-09 ENCOUNTER — Other Ambulatory Visit: Payer: Self-pay | Admitting: Family Medicine

## 2013-04-12 NOTE — Telephone Encounter (Signed)
Faxed

## 2013-04-18 ENCOUNTER — Inpatient Hospital Stay (HOSPITAL_COMMUNITY)
Admission: EM | Admit: 2013-04-18 | Discharge: 2013-04-21 | DRG: 312 | Disposition: A | Discharge status: Home / Self Care | Payer: Medicare Other | Attending: Family Medicine | Admitting: Family Medicine

## 2013-04-18 ENCOUNTER — Emergency Department (HOSPITAL_COMMUNITY): Payer: Medicare Other

## 2013-04-18 ENCOUNTER — Encounter (HOSPITAL_COMMUNITY): Payer: Self-pay | Admitting: Emergency Medicine

## 2013-04-18 DIAGNOSIS — I491 Atrial premature depolarization: Secondary | ICD-10-CM

## 2013-04-18 DIAGNOSIS — I499 Cardiac arrhythmia, unspecified: Secondary | ICD-10-CM

## 2013-04-18 DIAGNOSIS — I480 Paroxysmal atrial fibrillation: Secondary | ICD-10-CM | POA: Diagnosis present

## 2013-04-18 DIAGNOSIS — M25569 Pain in unspecified knee: Secondary | ICD-10-CM | POA: Diagnosis present

## 2013-04-18 DIAGNOSIS — Z800 Family history of malignant neoplasm of digestive organs: Secondary | ICD-10-CM

## 2013-04-18 DIAGNOSIS — G8929 Other chronic pain: Secondary | ICD-10-CM | POA: Diagnosis present

## 2013-04-18 DIAGNOSIS — I472 Ventricular tachycardia, unspecified: Secondary | ICD-10-CM | POA: Diagnosis not present

## 2013-04-18 DIAGNOSIS — I498 Other specified cardiac arrhythmias: Secondary | ICD-10-CM | POA: Diagnosis not present

## 2013-04-18 DIAGNOSIS — Z882 Allergy status to sulfonamides status: Secondary | ICD-10-CM

## 2013-04-18 DIAGNOSIS — A498 Other bacterial infections of unspecified site: Secondary | ICD-10-CM | POA: Diagnosis present

## 2013-04-18 DIAGNOSIS — R609 Edema, unspecified: Secondary | ICD-10-CM | POA: Diagnosis present

## 2013-04-18 DIAGNOSIS — Z79899 Other long term (current) drug therapy: Secondary | ICD-10-CM

## 2013-04-18 DIAGNOSIS — R55 Syncope and collapse: Secondary | ICD-10-CM | POA: Diagnosis present

## 2013-04-18 DIAGNOSIS — I4729 Other ventricular tachycardia: Secondary | ICD-10-CM

## 2013-04-18 DIAGNOSIS — Y92009 Unspecified place in unspecified non-institutional (private) residence as the place of occurrence of the external cause: Secondary | ICD-10-CM

## 2013-04-18 DIAGNOSIS — H269 Unspecified cataract: Secondary | ICD-10-CM | POA: Diagnosis present

## 2013-04-18 DIAGNOSIS — Z880 Allergy status to penicillin: Secondary | ICD-10-CM

## 2013-04-18 DIAGNOSIS — Z66 Do not resuscitate: Secondary | ICD-10-CM | POA: Diagnosis present

## 2013-04-18 DIAGNOSIS — S20219A Contusion of unspecified front wall of thorax, initial encounter: Secondary | ICD-10-CM | POA: Diagnosis present

## 2013-04-18 DIAGNOSIS — W19XXXA Unspecified fall, initial encounter: Secondary | ICD-10-CM | POA: Diagnosis present

## 2013-04-18 DIAGNOSIS — I4891 Unspecified atrial fibrillation: Secondary | ICD-10-CM | POA: Diagnosis present

## 2013-04-18 DIAGNOSIS — N390 Urinary tract infection, site not specified: Secondary | ICD-10-CM

## 2013-04-18 DIAGNOSIS — M1990 Unspecified osteoarthritis, unspecified site: Secondary | ICD-10-CM | POA: Diagnosis present

## 2013-04-18 DIAGNOSIS — R413 Other amnesia: Secondary | ICD-10-CM

## 2013-04-18 DIAGNOSIS — Z7982 Long term (current) use of aspirin: Secondary | ICD-10-CM

## 2013-04-18 DIAGNOSIS — H3530 Unspecified macular degeneration: Secondary | ICD-10-CM | POA: Diagnosis present

## 2013-04-18 DIAGNOSIS — R2681 Unsteadiness on feet: Secondary | ICD-10-CM

## 2013-04-18 DIAGNOSIS — Z888 Allergy status to other drugs, medicaments and biological substances status: Secondary | ICD-10-CM

## 2013-04-18 DIAGNOSIS — F0390 Unspecified dementia without behavioral disturbance: Secondary | ICD-10-CM | POA: Diagnosis present

## 2013-04-18 DIAGNOSIS — Z885 Allergy status to narcotic agent status: Secondary | ICD-10-CM

## 2013-04-18 DIAGNOSIS — Z7901 Long term (current) use of anticoagulants: Secondary | ICD-10-CM

## 2013-04-18 DIAGNOSIS — I10 Essential (primary) hypertension: Secondary | ICD-10-CM | POA: Diagnosis present

## 2013-04-18 DIAGNOSIS — M8448XA Pathological fracture, other site, initial encounter for fracture: Secondary | ICD-10-CM | POA: Diagnosis present

## 2013-04-18 DIAGNOSIS — R410 Disorientation, unspecified: Secondary | ICD-10-CM

## 2013-04-18 DIAGNOSIS — R269 Unspecified abnormalities of gait and mobility: Secondary | ICD-10-CM | POA: Diagnosis present

## 2013-04-18 LAB — POCT I-STAT, CHEM 8
BUN: 24 mg/dL — ABNORMAL HIGH (ref 6–23)
CREATININE: 1.1 mg/dL (ref 0.50–1.10)
Calcium, Ion: 1.17 mmol/L (ref 1.13–1.30)
Chloride: 102 mEq/L (ref 96–112)
GLUCOSE: 98 mg/dL (ref 70–99)
HCT: 44 % (ref 36.0–46.0)
HEMOGLOBIN: 15 g/dL (ref 12.0–15.0)
Potassium: 4.1 mEq/L (ref 3.7–5.3)
Sodium: 139 mEq/L (ref 137–147)
TCO2: 26 mmol/L (ref 0–100)

## 2013-04-18 LAB — PROTIME-INR
INR: 0.96 (ref 0.00–1.49)
PROTHROMBIN TIME: 12.6 s (ref 11.6–15.2)

## 2013-04-18 LAB — CBC
HEMATOCRIT: 43.4 % (ref 36.0–46.0)
HEMOGLOBIN: 14.6 g/dL (ref 12.0–15.0)
MCH: 31.1 pg (ref 26.0–34.0)
MCHC: 33.6 g/dL (ref 30.0–36.0)
MCV: 92.5 fL (ref 78.0–100.0)
Platelets: 277 10*3/uL (ref 150–400)
RBC: 4.69 MIL/uL (ref 3.87–5.11)
RDW: 13.7 % (ref 11.5–15.5)
WBC: 12.2 10*3/uL — ABNORMAL HIGH (ref 4.0–10.5)

## 2013-04-18 LAB — URINE MICROSCOPIC-ADD ON

## 2013-04-18 LAB — URINALYSIS, ROUTINE W REFLEX MICROSCOPIC
Bilirubin Urine: NEGATIVE
GLUCOSE, UA: NEGATIVE mg/dL
KETONES UR: NEGATIVE mg/dL
Nitrite: NEGATIVE
PROTEIN: NEGATIVE mg/dL
Specific Gravity, Urine: 1.011 (ref 1.005–1.030)
Urobilinogen, UA: 0.2 mg/dL (ref 0.0–1.0)
pH: 6.5 (ref 5.0–8.0)

## 2013-04-18 LAB — APTT: aPTT: 26 seconds (ref 24–37)

## 2013-04-18 LAB — TROPONIN I

## 2013-04-18 LAB — PRO B NATRIURETIC PEPTIDE: Pro B Natriuretic peptide (BNP): 1522 pg/mL — ABNORMAL HIGH (ref 0–450)

## 2013-04-18 LAB — D-DIMER, QUANTITATIVE (NOT AT ARMC): D-Dimer, Quant: 1.79 ug/mL-FEU — ABNORMAL HIGH (ref 0.00–0.48)

## 2013-04-18 MED ORDER — ACETAMINOPHEN 325 MG PO TABS: 650.0000 mg | ORAL_TABLET | Freq: Four times a day (QID) | ORAL | Status: DC | PRN

## 2013-04-18 MED ORDER — DEXTROSE 5 % IV SOLN
1.0000 g | INTRAVENOUS | Status: DC
  Administered 2013-04-18 – 2013-04-19 (×2): 1 g via INTRAVENOUS
  Filled 2013-04-18 (×2): qty 10

## 2013-04-18 MED ORDER — SODIUM CHLORIDE 0.9 % IV SOLN
INTRAVENOUS | Status: DC
  Administered 2013-04-18: 17:00:00 via INTRAVENOUS

## 2013-04-18 MED ORDER — SODIUM CHLORIDE 0.9 % IJ SOLN
3.0000 mL | Freq: Two times a day (BID) | INTRAMUSCULAR | Status: DC
  Administered 2013-04-18 – 2013-04-21 (×5): 3 mL via INTRAVENOUS

## 2013-04-18 MED ORDER — ACETAMINOPHEN 650 MG RE SUPP: 650.0000 mg | Freq: Four times a day (QID) | RECTAL | Status: DC | PRN

## 2013-04-18 MED ORDER — ASPIRIN EC 81 MG PO TBEC
81.0000 mg | DELAYED_RELEASE_TABLET | Freq: Every day | ORAL | Status: DC
  Administered 2013-04-18 – 2013-04-21 (×4): 81 mg via ORAL
  Filled 2013-04-18 (×4): qty 1

## 2013-04-18 MED ORDER — HEPARIN SODIUM (PORCINE) 5000 UNIT/ML IJ SOLN
5000.0000 [IU] | Freq: Three times a day (TID) | INTRAMUSCULAR | Status: DC
  Administered 2013-04-18 – 2013-04-21 (×8): 5000 [IU] via SUBCUTANEOUS
  Filled 2013-04-18 (×11): qty 1

## 2013-04-18 MED ORDER — IOHEXOL 350 MG/ML SOLN
80.0000 mL | Freq: Once | INTRAVENOUS | Status: AC | PRN
  Administered 2013-04-18: 80 mL via INTRAVENOUS

## 2013-04-18 NOTE — H&P (Signed)
[facility] Ad[facility] History and Physical Service Pager: [phone]  Patient name: Kristy LahBeatrice P Dumire Medical record number: [id number] Date of birth: 12/14/1919 Age: 80 y.o. Gender: female  Primary Care Provider: Elvina SidleLAUENSTEIN,KURT, MD of [place] [facility] Consultants: None Code Status: DNR/DNI  Chief Complaint: Near syncope  Assessment and Plan: Kristy Valencia is a 80 y.o. female presenting with near syncope at home. PMH is significant for recurrent UTIs, paroxysmal atrial fibrillation, and pedal edema.   Near-syncope: No focal neurological findings to suggest CVA/TIA - Leading diagnosis is arrythmia given history of atrial fibrillation and bigeminy on EKG today. Reports history of arrythmia with UTIs in the past.   -as per below, will cycle troponins, keep on telemetry, and perform echocardiogram and repeat AM EKG  -could consider consult to cardiology who has followed her for atrial fibrillation in the past - Doubt massive PE causing this in woman with no respiratory distress. D-dimer elevation to 1.79: CTA negative for PE - Orthostatic vital signs - PT/OT evaluate and treat  Right sided chest pain, initially reported as worse with inspiration -CTA negative as above -likely per daughter due to hitting her side on the fridge handle as she fell down, c/w with exam with MSK tenderness -cycle troponins as previously noted  Paroxysmal atrial fibrillation not on anticoagulation. Was previously on xarelto, which was discontinued presumably for bleeding risk outweighing stroke prevention benefit.  - Admit to telemetry - Troponin x1 negative; Cycle troponins  - Repeat ECG in AM - 2D echocardiogram (previous echo in 2012 with unclear results)   Presumed UTI: History of recurrent UTIs. U/A with many bacteria, WBC TNTC, urine culture in process. CTX given in ED.  - Continue rocephin - Patient reports no dysuria or polyuria though daughter reports some increased  confusion and history of arrythmia with UTI, for this reason will maintain CTX until culture results available.   Compression fracture seen on CXR: Superior endplate T12 compression fracture, age indeterminate. History of L1 compression fx. Not currently complaining of back pain, so low suspicion for acute fracture.   Baseline memory loss-suspect dementia. Memory problems in problem list. May benefit from outpatient workup/evaluation.   Lower extremity edema-will hold demedex reported by family as well as potassium and monitor BMET.   FEN/GI: Regular diet, SLIV Prophylaxis: Subcutaneous heparin  Disposition: Admit to FMTS, attending Dr. Jennette KettleNeal, to telemetry floor.   History of Present Illness: Kristy LahBeatrice P Flanary is a 80 y.o. female presenting on 1/14 with near-syncope and right-sided chest pain.   History is obtained by patient and her daughter, with whom she lives. This afternoon she was in the kitchen and was found to be leaning against the refrigerator.  She then lowered herself slowly to the ground without LOC or trauma. The patient does not remember what happened and when asked why she is in the hospital she believes it is because of "back problems." She reports chest wall tenderness that is mild, new and located on the right side in the mid-axillary line, worse with inspiration, which the daughter attributes to leaning on the refrigerator handle. She denies chest pain, shortness of breath, abdominal pain, N/V/D, dysuria or increased frequency. She has had intermittent palpitations including some today. Level 5 caveat applies due to likely history of "memory loss"/likely dementia. Much of history provided by daughter who witnessed the fall by her mother.   She is not on anticoagulants, but takes a baby aspirin. No history of clotting.   Review Of Systems: Per  HPI. Specifically patient denies fever, chills, dysuria, polyuria.  Otherwise 12 point review of systems was performed and was  unremarkable.  Patient Active Problem List   Diagnosis Date Noted  . Macular degeneration, left eye 08/13/2011  . Intermittent memory loss 08/13/2011  . Unsteady gait 08/13/2011  . PAF (paroxysmal atrial fibrillation) 09/18/2010   Past Medical History: Past Medical History  Diagnosis Date  . Palpitations   . Abdominal pain     LOWER  . PAF (paroxysmal atrial fibrillation)   . Dysuria   . TB (tuberculosis)   . OA (osteoarthritis)   . GERD (gastroesophageal reflux disease)   . Hypertension   . Macular degeneration   . Compression fracture of L1 lumbar vertebra   . Chest tightness   . VT (ventricular tachycardia)   . Chronic anticoagulation     Xarelto  . Cataract    Past Surgical History: Past Surgical History  Procedure Laterality Date  . Transthoracic echocardiogram  08/24/10    Left ventricle with wall thickness demonstrating moderate LVH.  Systolic function was decreased with   an estimated ejection fraction of 65-70%.  Pulmonary artery peak  pressure of 34 mmHg  . Chest x-ray  08/21/2010    Cardiomegaly with mild edema  . Breast biopsy      X2  . Appendectomy    . Abdominal hysterectomy     Social History: History  Substance Use Topics  . Smoking status: Never Smoker   . Smokeless tobacco: Not on file  . Alcohol Use: No   Additional social history: Lives at home with daughter who is HCPOA Please also refer to relevant sections of EMR.  Family History: Family History  Problem Relation Age of Onset  . Stomach cancer Father    Allergies and Medications: Allergies  Allergen Reactions  . Codeine Nausea And Vomiting and Other (See Comments)    Neurological problems-dizziness, altered mental state  . Procardia [Nifedipine] Other (See Comments)    CARDIAC ARREST  . Penicillins Other (See Comments)    Unknown   . Sulfa Drugs Cross Reactors Other (See Comments)    Unknown   . Azithromycin Hives, Itching and Rash    ALL MYCINS   No current  facility-administered medications on file prior to encounter.   Current Outpatient Prescriptions on File Prior to Encounter  Medication Sig Dispense Refill  . aspirin 81 MG tablet Take 81 mg by mouth daily.      . potassium chloride SA (K-DUR,KLOR-CON) 20 MEQ tablet Take 1 tablet (20 mEq total) by mouth daily.  30 tablet  5    Objective: BP 117/48  Pulse 116  Temp(Src) 97 F (36.1 C) (Oral)  Resp 14  SpO2 97% Exam: General: Well-appearing elderly woman in NAD HEENT: NCAT, MMM, no conjunctival pallor, + arcus senilis and cataracts bilaterally Cardiovascular: Bigeminal rhythm with rate in 80s-90s, light S3 noted, no JVD, 2+ radial pulses Respiratory: Nonlabored, normal rate, CTAB Abdomen: +BS, soft, NT, ND Extremities: Lower leg tenderness to palpation and 1+ edema bilaterally, warm and well perfused.  Skin: Multiple seborrheic keratoses on face and some on arms. No wounds or rashes noted.  Neuro: Alert, oriented to person and place "the hospital" not able to say year, current president, or situation. CN II-XII intact, finger to nose slow but wnl, heel to shin wnl. Strength 5/5 in all extremities. Rhomberg negative. Able to stand without assist.   Labs and Imaging: CBC BMET   Recent Labs Lab 04/18/13 1530 04/18/13 1539  WBC 12.2*  --   HGB 14.6 15.0  HCT 43.4 44.0  PLT 277  --     Recent Labs Lab 04/18/13 1539  NA 139  K 4.1  CL 102  BUN 24*  CREATININE 1.10  GLUCOSE 98    CXR 2v FINDINGS:  The heart size is normal. Biapical pleural parenchymal scarring is  noted. Emphysematous changes are stable. A moderate-sized hiatal  hernia is unchanged. Mild osteopenia is evident. Vertebral  augmentation at L1 is again noted. A superior endplate fracture at  T12 is new since 2008.  IMPRESSION:  1. No acute cardiopulmonary disease.  2. Moderate-sized hiatal hernia.  3. Emphysema.  4. Superior endplate T12 compression fracture is new since 2008. The  lesion is age  indeterminate.   04/18/2013 16:51  Color, Urine YELLOW  APPearance TURBID (A)  Specific Gravity, Urine 1.011  pH 6.5  Glucose NEGATIVE  Bilirubin Urine NEGATIVE  Ketones, ur NEGATIVE  Protein NEGATIVE  Urobilinogen, UA 0.2  Nitrite NEGATIVE  Leukocytes, UA LARGE (A)  Hgb urine dipstick SMALL (A)  WBC, UA TOO NUMEROUS TO COUNT  RBC / HPF 3-6  Squamous Epithelial / LPF RARE  Bacteria, UA MANY (A)  Casts HYALINE CASTS (A)    04/18/2013 16:59  D-Dimer, Quant 1.79 (H)  Prothrombin Time 12.6  INR 0.96  APTT 26  BNP: 1522  CT ANGIOGRAPHY CHEST WITH CONTRAST 1/14 - No CT findings for pulmonary embolism.  - No acute pulmonary findings.  - Tortuosity and atherosclerotic calcifications involving the aorta but no focal aneurysm or dissection.  - Large hiatal hernia.  Hazeline Junker, MD 04/18/2013, 5:54 PM PGY-1, [facility] FPTS Intern pager: [phone], text pages welcome  Family Medicine Upper Level Addendum:   I have seen and examined the patient independently, discussed with Grunz, fully reviewed the H+P and agree with it's contents with the additions as noted in blue text.    Tana Conch, MD, PGY-3 04/18/2013 10:36 PM

## 2013-04-18 NOTE — ED Notes (Signed)
[facility] EMS reports that pt was in the kitchen cooking when she suddenly felt weak and leaned against the refrigerator, pt daughter states that pt became slow to respond while trying to catch balance. No loc reported, pt refused EMS x1 but then c/o pain in side from leaning against refrigerator. Pt in nad, alert and oriented, denies any pain. EMS reports o2 saturation in low 90's, on 2L pt increased to 98% EKG shows a-fib, not known if pt has hx.

## 2013-04-18 NOTE — ED Provider Notes (Signed)
Medical screening examination/treatment/procedure(s) were conducted as a shared visit with non-physician practitioner(s) and myself.  I personally evaluated the patient during the encounter.  EKG Interpretation    Date/Time:  Wednesday April 18 2013 14:55:21 EST Ventricular Rate:  113 PR Interval:  143 QRS Duration: 76 QT Interval:  360 QTC Calculation: 494 R Axis:   65 Text Interpretation:  Sinus or ectopic atrial tachycardia Ventricular bigeminy new from prior Confirmed by Weda Baumgarner  MD, Aydia Maj ([id number]) on 04/18/2013 3:41:48 PM           Patient here after having a syncopal event at the facility where she lives. No prodromal symptoms prior to the event. Patient's EKG and lab work noted. Will consult the hospitalist as well as treat her UTI  Toy BakerAnthony T Toribio Seiber, MD 04/18/13 1725

## 2013-04-18 NOTE — ED Notes (Addendum)
IV team notified for IV start.

## 2013-04-18 NOTE — ED Notes (Signed)
Phlebotomy and MD at bedside.

## 2013-04-18 NOTE — ED Notes (Signed)
Pt attempted to urinate- no success. Transported to radiology.

## 2013-04-18 NOTE — ED Notes (Signed)
Daughter, Gunnar BullaFrayer Trim contact: HOME [phone] and cell [phone]

## 2013-04-18 NOTE — ED Notes (Signed)
Pt with midline tenderness to cervical spine. Pt does not remember what happened today. Family states pt is at baseline. There is some discrepancy between pt and family member on weather she hit her head or not.

## 2013-04-18 NOTE — ED Provider Notes (Signed)
CSN: [id number]     Arrival date & time 04/18/13  1439 History   First MD Initiated Contact with Patient 04/18/13 1446     Chief Complaint  Patient presents with  . Near Syncope   (Consider location/radiation/quality/duration/timing/severity/associated sxs/prior Treatment) Patient is a 80 y.o. female presenting with near-syncope.  Near Syncope Associated symptoms include chest pain and weakness. Pertinent negatives include no abdominal pain, chills, coughing, fever, headaches, nausea, neck pain, numbness, rash or vomiting.   80 yo female presents to ED after near syncopal event that occurred 2-3 hours ago. Patient roommate states patient went to kitchen to put dish in the sink. Roommate states about 7-8 minutes later she went to check on her in the kitchen and found her leaning against the fridge slowly sliding down. Patient denies LOC, head trauma. Patient States that her RIGHT side hurts underneath her breast area. States this pain is worse with inspiration. Per EMS patient Oxygen sat in low 90s though increased to 98% on 2 L O2. Patient roommate states that patient has no hx of medical conditions other than UTIs and arrhythmias.  Past Medical History  Diagnosis Date  . Palpitations   . Abdominal pain     LOWER  . PAF (paroxysmal atrial fibrillation)   . Dysuria   . TB (tuberculosis)   . OA (osteoarthritis)   . GERD (gastroesophageal reflux disease)   . Hypertension   . Macular degeneration   . Compression fracture of L1 lumbar vertebra   . Chest tightness   . VT (ventricular tachycardia)   . Chronic anticoagulation     Xarelto  . Cataract    Past Surgical History  Procedure Laterality Date  . Transthoracic echocardiogram  08/24/10    Left ventricle with wall thickness demonstrating moderate LVH.  Systolic function was decreased with   an estimated ejection fraction of 65-70%.  Pulmonary artery peak  pressure of 34 mmHg  . Chest x-ray  08/21/2010    Cardiomegaly with mild edema   . Breast biopsy      X2  . Appendectomy    . Abdominal hysterectomy     Family History  Problem Relation Age of Onset  . Stomach cancer Father    History  Substance Use Topics  . Smoking status: Never Smoker   . Smokeless tobacco: Not on file  . Alcohol Use: No   OB History   Grav Para Term Preterm Abortions TAB SAB Ect Mult Living                 Review of Systems  Constitutional: Negative for fever and chills.  Eyes: Negative for visual disturbance.  Respiratory: Negative for cough, chest tightness and shortness of breath.   Cardiovascular: Positive for chest pain, palpitations and near-syncope. Negative for leg swelling.  Gastrointestinal: Negative for nausea, vomiting, abdominal pain, diarrhea, constipation and blood in stool.  Genitourinary: Negative for dysuria and hematuria.  Musculoskeletal: Negative for neck pain and neck stiffness.  Skin: Negative for rash.  Allergic/Immunologic: Negative for immunocompromised state.  Neurological: Positive for weakness. Negative for dizziness, facial asymmetry, speech difficulty, numbness and headaches.  All other systems reviewed and are negative.    Allergies  Codeine; Procardia; Penicillins; Sulfa drugs cross reactors; and Azithromycin  Home Medications   No current outpatient prescriptions on file. BP 134/57  Pulse 107  Temp(Src) 98.6 F (37 C) (Oral)  Resp 18  Ht 5\' 9"  (1.753 m)  Wt 159 lb 13.3 oz (72.5 kg)  BMI 23.59  kg/m2  SpO2 96% Physical Exam  Nursing note and vitals reviewed. Constitutional: She is oriented to person, place, and time. She appears well-developed and well-nourished. No distress.  HENT:  Head: Normocephalic and atraumatic.  Neck: Normal range of motion. Neck supple. No JVD present. No tracheal deviation present.  Cardiovascular: Intact distal pulses.  An irregularly irregular rhythm present. Tachycardia present.  Exam reveals no gallop and no friction rub.   No murmur  heard. Pulmonary/Chest: Effort normal and breath sounds normal. No accessory muscle usage. Not tachypneic. No respiratory distress. She has no wheezes. She has no rhonchi. She has no rales.  Abdominal: Soft. There is no tenderness.  Musculoskeletal: Normal range of motion. She exhibits no edema.  Neurological: She is alert and oriented to person, place, and time. She has normal strength. No cranial nerve deficit or sensory deficit.  Reflex Scores:      Bicep reflexes are 2+ on the right side and 2+ on the left side.      Patellar reflexes are 2+ on the right side and 2+ on the left side. Skin: Skin is warm and dry. She is not diaphoretic.  Psychiatric: She has a normal mood and affect. Her behavior is normal.    ED Course  Procedures (including critical care time) Labs Review Labs Reviewed  CBC - Abnormal; Notable for the following:    WBC 12.2 (*)    All other components within normal limits  URINALYSIS, ROUTINE W REFLEX MICROSCOPIC - Abnormal; Notable for the following:    APPearance TURBID (*)    Hgb urine dipstick SMALL (*)    Leukocytes, UA LARGE (*)    All other components within normal limits  PRO B NATRIURETIC PEPTIDE - Abnormal; Notable for the following:    Pro B Natriuretic peptide (BNP) 1522.0 (*)    All other components within normal limits  D-DIMER, QUANTITATIVE - Abnormal; Notable for the following:    D-Dimer, Quant 1.79 (*)    All other components within normal limits  URINE MICROSCOPIC-ADD ON - Abnormal; Notable for the following:    Bacteria, UA MANY (*)    Casts HYALINE CASTS (*)    All other components within normal limits  POCT I-STAT, CHEM 8 - Abnormal; Notable for the following:    BUN 24 (*)    All other components within normal limits  URINE CULTURE  PROTIME-INR  APTT  TROPONIN I  BASIC METABOLIC PANEL  TROPONIN I   Imaging Review Dg Chest 2 View  04/18/2013   CLINICAL DATA:  Pain.  Frequent urination.  EXAM: CHEST  2 VIEW  COMPARISON:  Two-view  chest 08/21/2010.  FINDINGS: The heart size is normal. Biapical pleural parenchymal scarring is noted. Emphysematous changes are stable. A moderate-sized hiatal hernia is unchanged. Mild osteopenia is evident. Vertebral augmentation at L1 is again noted. A superior endplate fracture at T12 is new since 2008.  IMPRESSION: 1. No acute cardiopulmonary disease. 2. Moderate-sized hiatal hernia. 3. Emphysema. 4. Superior endplate T12 compression fracture is new since 2008. The lesion is age indeterminate.   Electronically Signed   By: Gennette Pac M.D.   On: 04/18/2013 16:40   Ct Angio Chest Pe W/cm &/or Wo Cm  04/18/2013   CLINICAL DATA:  Pleuritic chest pain.  Elevated D-dimer.  EXAM: CT ANGIOGRAPHY CHEST WITH CONTRAST  TECHNIQUE: Multidetector CT imaging of the chest was performed using the standard protocol during bolus administration of intravenous contrast. Multiplanar CT image reconstructions including MIPs were obtained to  evaluate the vascular anatomy.  CONTRAST:  80mL OMNIPAQUE IOHEXOL 350 MG/ML SOLN  COMPARISON:  None.  FINDINGS: The chest wall is unremarkable. No breast masses, supraclavicular or axillary mass or adenopathy. Small scattered lymph nodes are noted. The thyroid gland is grossly normal. The bony thorax is intact. No destructive bone lesions or spinal canal compromise. There is a mild compression deformity of the T11 vertebral body of uncertain age. This is likely remote given I do not see any paraspinal hematoma. There is a dilated right-sided nerve root sheath on the right side.  The heart is normal in size for age. No pericardial effusion. There are scattered mediastinal and hilar lymph nodes but no mass or overt adenopathy. There is tortuosity, ectasia and calcification of the thoracic aorta but no focal aneurysm or dissection. The esophagus demonstrates a very large hiatal hernia.  The pulmonary arterial tree is well opacified. No filling defects to suggest pulmonary emboli.  Examination  of the lung parenchyma demonstrates apical scarring changes and chronic bronchitic changes but no acute infiltrates, effusion, edema or bronchiectasis. No pleural effusion. No worrisome pulmonary nodules or masses.  The upper abdomen is unremarkable. A simple appearing hepatic cyst is noted.  Review of the MIP images confirms the above findings.  IMPRESSION: No CT findings for pulmonary embolism.  No acute pulmonary findings.  Tortuosity and atherosclerotic calcifications involving the aorta but no focal aneurysm or dissection.  Large hiatal hernia.   Electronically Signed   By: Loralie ChampagneMark  Gallerani M.D.   On: 04/18/2013 19:47    EKG Interpretation    Date/Time:  Wednesday April 18 2013 14:55:21 EST Ventricular Rate:  113 PR Interval:  143 QRS Duration: 76 QT Interval:  360 QTC Calculation: 494 R Axis:   65 Text Interpretation:  Sinus or ectopic atrial tachycardia Ventricular bigeminy new from prior Confirmed by ALLEN  MD, ANTHONY ([id number]) on 04/18/2013 3:41:48 PM            MDM   1. Syncope   2. UTI (lower urinary tract infection)    Troponin Negative. EKG shows sinus tachycardia with ventricular bigeminy, changed from prior study.  CXR shows no acute cardiopulmonary disease. T12 compression fracture. Emphysema. Moderate hiatal hernia.   UA consistent with UTI. Leukocytosis at 12.2 D-dimer positive   Patient discussed with Dr. Lorre NickAnthony Allen. Plan to admit patient to Hospitalist group.    Meds given in ED:  Medications  0.9 %  sodium chloride infusion ( Intravenous Transfusing/Transfer 04/18/13 2010)  cefTRIAXone (ROCEPHIN) 1 g in dextrose 5 % 50 mL IVPB (0 g Intravenous Stopped 04/18/13 1903)  heparin injection 5,000 Units (not administered)  sodium chloride 0.9 % injection 3 mL (3 mLs Intravenous Given 04/18/13 2029)  acetaminophen (TYLENOL) tablet 650 mg (not administered)    Or  acetaminophen (TYLENOL) suppository 650 mg (not administered)  aspirin EC tablet 81 mg (not  administered)  iohexol (OMNIPAQUE) 350 MG/ML injection 80 mL (80 mLs Intravenous Contrast Given 04/18/13 1941)    Current Discharge Medication List        Rudene AndaJacob Gray Demon Volante, [place]PA-C 04/18/13 2229

## 2013-04-18 NOTE — ED Notes (Addendum)
Pt transported to CT ?

## 2013-04-19 ENCOUNTER — Encounter (HOSPITAL_COMMUNITY): Payer: Self-pay | Admitting: Physician Assistant

## 2013-04-19 DIAGNOSIS — R55 Syncope and collapse: Principal | ICD-10-CM

## 2013-04-19 DIAGNOSIS — R410 Disorientation, unspecified: Secondary | ICD-10-CM

## 2013-04-19 DIAGNOSIS — I4891 Unspecified atrial fibrillation: Secondary | ICD-10-CM

## 2013-04-19 LAB — BASIC METABOLIC PANEL WITH GFR
BUN: 21 mg/dL (ref 6–23)
CO2: 25 meq/L (ref 19–32)
Calcium: 8.9 mg/dL (ref 8.4–10.5)
Chloride: 102 meq/L (ref 96–112)
Creatinine, Ser: 0.77 mg/dL (ref 0.50–1.10)
GFR calc Af Amer: 82 mL/min — ABNORMAL LOW
GFR calc non Af Amer: 71 mL/min — ABNORMAL LOW
Glucose, Bld: 97 mg/dL (ref 70–99)
Potassium: 4 meq/L (ref 3.7–5.3)
Sodium: 140 meq/L (ref 137–147)

## 2013-04-19 LAB — TROPONIN I: Troponin I: <0.3 ng/mL (ref <0.30)

## 2013-04-19 LAB — MAGNESIUM: Magnesium: 2 mg/dL (ref 1.5–2.5)

## 2013-04-19 NOTE — Progress Notes (Signed)
Echocardiogram completed.

## 2013-04-19 NOTE — Progress Notes (Signed)
Family Medicine Teaching Service Daily Progress Note Intern Pager: [phone]  Patient name: Kristy LahBeatrice P Valencia Medical record number: [id number] Date of birth: 05/21/1919 Age: 80 y.o. Gender: female  Primary Care Provider: Elvina SidleLAUENSTEIN,KURT, MD Consultants: none Code Status: DNR/DNI  Pt Overview and Major Events to Date:  1/14: Admitted for near syncope at home and UTI   Assessment and Plan: Kristy Valencia is a 80 y.o. female presenting with near syncope at home. PMH is significant for recurrent UTIs, paroxysmal atrial fibrillation, and pedal edema.   #Near-syncope: No episodes since arrival.  - Orthostatic vital signs: normal  - PT/OT evaluate and treat  - EKG AM  - ECHO pending   #Right sided chest pain: likely related to hitting the fridge handle when she fell down.  - D-dimer elevation to 1.79: CTA negative for PE   #Paroxysmal atrial fibrillation not on anticoagulation. Was previously on xarelto, which was discontinued presumably for bleeding risk outweighing stroke prevention benefit.  - Cycle troponins (-)  - Repeat ECG in AM  - 2D echocardiogram (previous echo in 2012 with unclear results)   #Presumed UTI: History of recurrent UTIs. U/A with many bacteria, WBC TNTC, urine culture in process. CTX given in ED.  - Continue rocephin, transition to Keflex tomorrow.  - Urine cx: >100,000 E.coli, awaiting sensitivities to transition to PO   #Compression fracture seen on CXR: Superior endplate T12 compression fracture, age indeterminate. History of L1 compression fx. Not currently complaining of back pain, so low suspicion for acute fracture.   #Baseline memory loss-suspect dementia. Memory problems in problem list. May benefit from outpatient workup/evaluation.   #Lower extremity edema-will hold demedex reported by family as well as potassium and monitor BMET.   FEN/GI: Regular diet, SLIV  Prophylaxis: Subcutaneous heparin   Disposition: pending morning EKG and resolution of  symptoms.   Subjective: patient had no overnight events. She is complaining of knee pain this AM but this is a chronic pain. She has had surgery in both knees but cannot remember the surgery.   Objective: Temp:  [97 F (36.1 C)-98.6 F (37 C)] 98.4 F (36.9 C) (01/15 0556) Pulse Rate:  [48-116] 67 (01/15 0556) Resp:  [14-22] 20 (01/15 0556) BP: (108-176)/(48-88) 121/53 mmHg (01/15 0556) SpO2:  [93 %-100 %] 99 % (01/15 0556) Weight:  [159 lb 8 oz (72.349 kg)-159 lb 13.3 oz (72.5 kg)] 159 lb 8 oz (72.349 kg) (01/15 0556) Physical Exam: General: Well-appearing elderly woman in NAD  Cardiovascular: irregular rhythm with regular rate, light S3 noted, 2+ radial pulses  Respiratory: Nonlabored, normal rate, CTAB  Abdomen: +BS, soft, NT, ND  Extremities: Lower leg tenderness to palpation and 1+ edema bilaterally, warm and well perfused.  Skin: Multiple seborrheic keratoses on face and some on arms. No wounds or rashes noted.  Neuro: Alert   Laboratory:  Recent Labs Lab 04/18/13 1530 04/18/13 1539  WBC 12.2*  --   HGB 14.6 15.0  HCT 43.4 44.0  PLT 277  --     Recent Labs Lab 04/18/13 1539 04/19/13 0540  NA 139 140  K 4.1 4.0  CL 102 102  CO2  --  25  BUN 24* 21  CREATININE 1.10 0.77  CALCIUM  --  8.9  GLUCOSE 98 97     Recent Labs Lab 04/18/13 1833 04/19/13 0540  TROPONINI <0.30 <0.30    Imaging/Diagnostic Tests:   Myra RudeJeremy E Schmitz, MD 04/19/2013, 8:09 AM PGY-1, [facility] FPTS Intern pager: [phone], text pages  welcome

## 2013-04-19 NOTE — Progress Notes (Signed)
FMTS Attending Note  The plan of care was discussed with the resident team. I agree with the assessment and plan as documented by the resident.  Cardiology to be consulted in setting of new onset Bigeminy in setting of presyncopal episode.  Donnella ShamKyle Linden Mikes MD

## 2013-04-19 NOTE — Care Management Note (Unsigned)
    Page 1 of 1   04/19/2013     3:55:21 PM   CARE MANAGEMENT NOTE 04/19/2013  Patient:  Kristy Valencia,Kristy Valencia   Account Number:  [id number]  Date Initiated:  04/19/2013  Documentation initiated by:  Kayvon Mo  Subjective/Objective Assessment:   PT ADM ON 1/14 WITH NEAR SYNCOPE, UTI.  PTA, PT RESIDES AT HOME WITH DAUGHTER.     Action/Plan:   WILL FOLLOW FOR DC NEEDS AS PT PROGRESSES.   Anticipated DC Date:  04/20/2013   Anticipated DC Plan:  HOME/SELF CARE      DC Planning Services  CM consult      Choice offered to / List presented to:             Status of service:  In process, will continue to follow Medicare Important Message given?   (If response is "NO", the following Medicare IM given date fields will be blank) Date Medicare IM given:   Date Additional Medicare IM given:    Discharge Disposition:    Per UR Regulation:  Reviewed for med. necessity/level of care/duration of stay  If discussed at Long Length of Stay Meetings, dates discussed:    Comments:

## 2013-04-19 NOTE — H&P (Signed)
FMTS Attending Admission Note: Kristy LevySara Neal MD [phone] pager office [phone] I  have seen and examined this patient, reviewed their chart. I have discussed this patient with the resident. I agree with the resident's findings, assessment and care plan. Pleasant and in no distress this morning. She feels she "went down" because her knees "gave out " on her secondary to arthritis ---but admits she may have been a little dizzy as well. Agree with plan as above.I reviewed her EKGs from past and a tthis admission. Now and one earlier one she showed what may be a strain pattern so agree with a single troponin. No chest pain, clinically she looks euvolemic. Small bruis and some tenderness on her left lower rib area but I agree this pain most consistent with contusion. CTA negative.

## 2013-04-19 NOTE — Progress Notes (Deleted)
  Echocardiogram 2D Echocardiogram has been performed.  Cathie BeamsGREGORY, Kristy Valencia 04/19/2013, 10:39 AM

## 2013-04-19 NOTE — Consult Note (Signed)
CARDIOLOGY CONSULT NOTE   Patient ID: Kristy Valencia MRN: [id number] DOB/AGE: 78/07/1919 78 y.o.  Admit date: 04/18/2013  Primary Physician   Elvina SidleLAUENSTEIN,KURT, MD Primary Cardiologist   Nahser - last seen March 2013 Reason for Consultation  Abnl ECG  WUJ:WJXBJYNWHPI:Sharday P Kristy Valencia is a 80 y.o. female with a history of PAF.  Previously anticoagulated, Xarelto in 2012, then warfarin in 2013. Systemic anticoagulation and metoprolol d/c'd by Dr. Milus GlazierLauenstein in 2013 due to fall risk and low BP. She has been on torsemide and Kdur for LE edema. She was admitted 01/14 for near-syncope. Her ECG at that time showed possible ectopic atrial rhythm with bigeminy PVCs. Today, ECG is SR with frequent PVCs. Cardiology was asked to evaluate.  Ms. Kristy Valencia is oriented to name only. She thinks she is a home in a big house. She states she occasionally gets chest pain, not able to give details. She denies SOB. Some awareness of LE edema but it doesn't bother her. She denies palpitations, presyncope.   Per ER/H&P info, pt was standing in the kitchen, had decreased LOC but did not completely lose consciousness. She sank to the ground. Initially refused EMS but then agreed when she developed chest pain, possibly from leaning against or hitting the refrigerator door handle. On admission, her orthostatic VS were positive, BP lying 141/61-85, standing 117/48-116.    Past Medical History  Diagnosis Date  . Abdominal pain     LOWER  . PAF (paroxysmal atrial fibrillation)   . Dysuria   . TB (tuberculosis)   . OA (osteoarthritis)   . GERD (gastroesophageal reflux disease)   . Hypertension   . Macular degeneration   . Compression fracture of L1 lumbar vertebra   . Chest tightness   . VT (ventricular tachycardia)   . Chronic anticoagulation     Xarelto  . Cataract      Past Surgical History  Procedure Laterality Date  . Transthoracic echocardiogram  08/24/10    Left ventricle with wall thickness demonstrating  moderate LVH.  Systolic function was decreased with   an estimated ejection fraction of 65-70%.  Pulmonary artery peak  pressure of 34 mmHg  . Chest x-ray  08/21/2010    Cardiomegaly with mild edema  . Breast biopsy      X2  . Appendectomy    . Abdominal hysterectomy      Allergies  Allergen Reactions  . Codeine Nausea And Vomiting and Other (See Comments)    Neurological problems-dizziness, altered mental state  . Procardia [Nifedipine] Other (See Comments)    CARDIAC ARREST  . Penicillins Other (See Comments)    Unknown - Rec'd Rocephin in the ED   . Sulfa Drugs Cross Reactors Other (See Comments)    Unknown   . Azithromycin Hives, Itching and Rash    ALL MYCINS    I have reviewed the patient's current medications . aspirin EC  81 mg Oral Daily  . cefTRIAXone (ROCEPHIN)  IV  1 g Intravenous Q24H  . heparin  5,000 Units Subcutaneous Q8H  . sodium chloride  3 mL Intravenous Q12H   . sodium chloride 15 mL/hr at 04/18/13 1705   acetaminophen, acetaminophen  Prior to Admission medications   Medication Sig Start Date  aspirin 81 MG tablet Take 81 mg by mouth daily.   potassium chloride SA (K-DUR,KLOR-CON) 20 MEQ tablet Take 1 tablet (20 mEq total) by mouth daily. 03/15/13  torsemide (DEMADEX) 20 MG tablet Take 20 mg by mouth  daily.    History   Social History  . Marital Status: Widowed    Spouse Name: N/A    Number of Children: N/A  . Years of Education: N/A   Occupational History  . Retired [employer]   Social History Main Topics  . Smoking status: Never Smoker   . Smokeless tobacco: Not on file  . Alcohol Use: No  . Drug Use: No  . Sexual Activity: No   Other Topics Concern  . Not on file   Social History Narrative   Lives with daughter   Family Status  Relation Status Death Age  . Mother Deceased 44  . Father Deceased 50  . Brother Deceased 38    TB   Family History  Problem Relation Age of Onset  . Stomach cancer Father      ROS:   Full 14 point review of systems complete and found to be negative unless listed above.  Physical Exam: Blood pressure 121/53, pulse 67, temperature 98.4 F (36.9 C), temperature source Oral, resp. rate 20, height 5\' 9"  (1.753 m), weight 159 lb 8 oz (72.349 kg), SpO2 99.00%.  General: Well developed, elderly, female in no acute distress Head: Eyes PERRLA, No xanthomas.   Normocephalic and atraumatic, oropharynx without edema or exudate. Dentition: poor Lungs: Slightly decreased BS bases Heart: Heart irregular rate and rhythm with S1, S2; no significant murmur. pulses are 2+ upper extrem. Decreased in lower extremities due to edema Neck: No carotid bruits. No lymphadenopathy.  JVD not elevated. Abdomen: Bowel sounds present, abdomen soft and non-tender without masses or hernias noted. Msk:  No spine or cva tenderness. No weakness, no joint deformities or effusions. Extremities: No clubbing or cyanosis. Trace-1+ edema.  Neuro: Alert and oriented x 1. No focal deficits noted. Psych:  Good affect, responds appropriately Skin: No rashes or lesions noted.  Labs:   Lab Results  Component Value Date   WBC 12.2* 04/18/2013   HGB 15.0 04/18/2013   HCT 44.0 04/18/2013   MCV 92.5 04/18/2013   PLT 277 04/18/2013    Recent Labs  04/18/13 1659  INR 0.96     Recent Labs Lab 04/19/13 0540  NA 140  K 4.0  CL 102  CO2 25  BUN 21  CREATININE 0.77  CALCIUM 8.9  GLUCOSE 97    Recent Labs  04/18/13 1833 04/19/13 0540  TROPONINI <0.30 <0.30   Pro B Natriuretic peptide (BNP)  Date/Time Value Range Status  04/18/2013  4:59 PM 1522.0* 0 - 450 pg/mL Final  08/21/2010  8:06 PM 308.6  0 - 450 pg/mL Final   Lab Results  Component Value Date   DDIMER 1.79* 04/18/2013   Urinalysis    Component Value Date/Time   COLORURINE YELLOW 04/18/2013 1651   APPEARANCEUR TURBID* 04/18/2013 1651   LABSPEC 1.011 04/18/2013 1651   PHURINE 6.5 04/18/2013 1651   GLUCOSEU NEGATIVE 04/18/2013 1651   HGBUR SMALL*  04/18/2013 1651   BILIRUBINUR NEGATIVE 04/18/2013 1651   BILIRUBINUR NEG 06/21/2011 1220   KETONESUR NEGATIVE 04/18/2013 1651   PROTEINUR NEGATIVE 04/18/2013 1651   UROBILINOGEN 0.2 04/18/2013 1651   UROBILINOGEN 0.2 06/21/2011 1220   NITRITE NEGATIVE 04/18/2013 1651   LEUKOCYTESUR LARGE* 04/18/2013 1651    Echo: pending  ECG: 01/15 SR, PVCs seen HR 75  01/14 ?ectopic atrial rhythm Rate 113, bigeminy  Radiology:  Dg Chest 2 View 04/18/2013   CLINICAL DATA:  Pain.  Frequent urination.  EXAM: CHEST  2 VIEW  COMPARISON:  Two-view chest 08/21/2010.  FINDINGS: The heart size is normal. Biapical pleural parenchymal scarring is noted. Emphysematous changes are stable. A moderate-sized hiatal hernia is unchanged. Mild osteopenia is evident. Vertebral augmentation at L1 is again noted. A superior endplate fracture at T12 is new since 2008.  IMPRESSION: 1. No acute cardiopulmonary disease. 2. Moderate-sized hiatal hernia. 3. Emphysema. 4. Superior endplate T12 compression fracture is new since 2008. The lesion is age indeterminate.   Electronically Signed   By: Gennette Pac M.D.   On: 04/18/2013 16:40   Ct Angio Chest Pe W/cm &/or Wo Cm 04/18/2013   CLINICAL DATA:  Pleuritic chest pain.  Elevated D-dimer.  EXAM: CT ANGIOGRAPHY CHEST WITH CONTRAST  TECHNIQUE: Multidetector CT imaging of the chest was performed using the standard protocol during bolus administration of intravenous contrast. Multiplanar CT image reconstructions including MIPs were obtained to evaluate the vascular anatomy.  CONTRAST:  80mL OMNIPAQUE IOHEXOL 350 MG/ML SOLN  COMPARISON:  None.  FINDINGS: The chest wall is unremarkable. No breast masses, supraclavicular or axillary mass or adenopathy. Small scattered lymph nodes are noted. The thyroid gland is grossly normal. The bony thorax is intact. No destructive bone lesions or spinal canal compromise. There is a mild compression deformity of the T11 vertebral body of uncertain age. This is  likely remote given I do not see any paraspinal hematoma. There is a dilated right-sided nerve root sheath on the right side.  The heart is normal in size for age. No pericardial effusion. There are scattered mediastinal and hilar lymph nodes but no mass or overt adenopathy. There is tortuosity, ectasia and calcification of the thoracic aorta but no focal aneurysm or dissection. The esophagus demonstrates a very large hiatal hernia.  The pulmonary arterial tree is well opacified. No filling defects to suggest pulmonary emboli.  Examination of the lung parenchyma demonstrates apical scarring changes and chronic bronchitic changes but no acute infiltrates, effusion, edema or bronchiectasis. No pleural effusion. No worrisome pulmonary nodules or masses.  The upper abdomen is unremarkable. A simple appearing hepatic cyst is noted.  Review of the MIP images confirms the above findings.  IMPRESSION: No CT findings for pulmonary embolism.  No acute pulmonary findings.  Tortuosity and atherosclerotic calcifications involving the aorta but no focal aneurysm or dissection.  Large hiatal hernia.   Electronically Signed   By: Loralie Champagne M.D.   On: 04/18/2013 19:47    ASSESSMENT AND PLAN:   The patient was seen today by Dr. Myrtis Ser, the patient evaluated and the data reviewed.      Ectopic atrial rhythm:       no clear symptoms related to this, resolved spontaneously. No bradycardia seen. No further workup    Ventricular bigeminy       : improved spontaneously, K+ 4.0, will ck magnesium. No further evaluation indicated.    Near syncope -        orthostatics on admission were positive, will recheck today and watch for symptoms, arrhythmia. Would hydrate gingerly and follow her orthostatic blood pressures. May have to live with higher BP,  and slight increase in her edema.. PO intake may be poor. Management per IM.  Plan to decrease her diuretic dose. Plan to continue to adjust all of her medicines in the hospital  until she is no longer orthostatic    UTI - per IM.  Confusion    I do not know if her confusion is significant enough to affect her oral intake at home.  Will f/u on echo results but think non-aggressive plan is best.   Signed: Theodore Demark, PA-C 04/19/2013 2:00 PM Beeper [phone] Patient seen and examined. I agree with the assessment and plan as detailed above. See also my additional thoughts below.   I have reviewed the note above completely. I have modified the note to reflect my plan in the final problem list.  Willa Rough, MD, [facility] 04/19/2013 3:35 PM

## 2013-04-19 NOTE — ED Provider Notes (Signed)
Medical screening examination/treatment/procedure(s) were conducted as a shared visit with non-physician practitioner(s) and myself.  I personally evaluated the patient during the encounter.  EKG Interpretation    Date/Time:  Wednesday April 18 2013 14:55:21 EST Ventricular Rate:  113 PR Interval:  143 QRS Duration: 76 QT Interval:  360 QTC Calculation: 494 R Axis:   65 Text Interpretation:  Sinus or ectopic atrial tachycardia Ventricular bigeminy new from prior Confirmed by Freida BusmanALLEN  MD, Haizlee Henton ([id number]) on 04/18/2013 3:41:48 PM             Toy BakerAnthony T Mare Ludtke, MD 04/19/13 1555

## 2013-04-19 NOTE — Progress Notes (Signed)
PT Cancellation Note  Patient Details Name: Kristy Valencia MRN: [id number] DOB: 05/01/1919   Cancelled Treatment:    Reason Eval/Treat Not Completed: Patient at procedure or test/unavailable 04/19/2013  Kristy Valencia, PT [phone]  (pager)   Kristy Valencia, Kristy Valencia 04/19/2013, 5:48 PM

## 2013-04-20 ENCOUNTER — Encounter (HOSPITAL_COMMUNITY): Payer: Self-pay | Admitting: Nurse Practitioner

## 2013-04-20 DIAGNOSIS — I472 Ventricular tachycardia: Secondary | ICD-10-CM

## 2013-04-20 DIAGNOSIS — R269 Unspecified abnormalities of gait and mobility: Secondary | ICD-10-CM

## 2013-04-20 DIAGNOSIS — I4729 Other ventricular tachycardia: Secondary | ICD-10-CM

## 2013-04-20 DIAGNOSIS — I498 Other specified cardiac arrhythmias: Secondary | ICD-10-CM

## 2013-04-20 DIAGNOSIS — I499 Cardiac arrhythmia, unspecified: Secondary | ICD-10-CM

## 2013-04-20 DIAGNOSIS — I10 Essential (primary) hypertension: Secondary | ICD-10-CM

## 2013-04-20 DIAGNOSIS — I491 Atrial premature depolarization: Secondary | ICD-10-CM

## 2013-04-20 DIAGNOSIS — N390 Urinary tract infection, site not specified: Secondary | ICD-10-CM

## 2013-04-20 LAB — URINE CULTURE: Colony Count: >=100000

## 2013-04-20 MED ORDER — METOPROLOL TARTRATE 1 MG/ML IV SOLN
INTRAVENOUS | Status: AC
  Administered 2013-04-20: 2.5 mg via INTRAVENOUS
  Filled 2013-04-20: qty 10

## 2013-04-20 MED ORDER — CEPHALEXIN 500 MG PO CAPS
500.0000 mg | ORAL_CAPSULE | Freq: Two times a day (BID) | ORAL | Status: DC
  Administered 2013-04-20 – 2013-04-21 (×3): 500 mg via ORAL
  Filled 2013-04-20 (×4): qty 1

## 2013-04-20 MED ORDER — METOPROLOL TARTRATE 1 MG/ML IV SOLN
2.5000 mg | INTRAVENOUS | Status: AC | PRN
  Administered 2013-04-20 (×3): 2.5 mg via INTRAVENOUS

## 2013-04-20 MED ORDER — AMIODARONE HCL 200 MG PO TABS
400.0000 mg | ORAL_TABLET | Freq: Two times a day (BID) | ORAL | Status: DC
  Administered 2013-04-20 (×2): 400 mg via ORAL
  Filled 2013-04-20 (×4): qty 2

## 2013-04-20 NOTE — Progress Notes (Signed)
FMTS Attending Note  I personally saw and evaluated the patient. The plan of care was discussed with the resident team. I agree with the assessment and plan as documented by the resident.   Patient evaluated in hospital room, no family members present, patient ambulating in room, she is oriented to self however does not know place or time, no acute pain, Nursing staff informed that patient was ambulatory - RN to put on bed alarm as she is a fall risk, she may require sitter if not family members are present  Echocardiogram: EF 55-60%  1. Presyncopal Episode - suspect orthostasis vs cardiac cause, she has had multiple abnormal rhythms on telemetry including Afib with RVR, bradycardia, PVC's and non-sustained Vtach, Cardiology is aware of these findings and initiated Amiodarone 2. Paroxysmal Atrial Fibrillation - not a candidate for anticoagluation given fall risk, Amiodarone initiated by Cardiology 3. Right sided chest pain resolved - likely contusion due to presyncopal episode 4. UTI - agree with therapy as outlined in resident note  Donnella ShamKyle Kassidy Frankson MD

## 2013-04-20 NOTE — Progress Notes (Signed)
Pt went into A fib HR ranging between 150s to 160s, BP 139/65. Pt asymptomatic and resting peacefully in bed. EKG completed. Paged cardiologist on call awaiting call back. Will continue to monitor.  Marnell Mcdaniel M

## 2013-04-20 NOTE — Evaluation (Signed)
Physical Therapy Evaluation Patient Details Name: Kristy Valencia MRN: [id number] DOB: 05/28/1919 Today's Date: 04/20/2013 Time: [id number] PT Time Calculation (min): 15 min  PT Assessment / Plan / Recommendation History of Present Illness  adm with near syncope likely related to arrhythmias (pt has had associated with UTIs in the past). Orthostatic BPs negative. PMHx macular degeneration, L1 compression fx, CAD, afib  Clinical Impression  Patient evaluated by Physical Therapy with no further acute PT needs identified. Neither the patient or daughter has  further questions. PT is signing off. Thank you for this referral.     PT Assessment  Patent does not need any further PT services    Follow Up Recommendations  No PT follow up    Does the patient have the potential to tolerate intense rehabilitation      Barriers to Discharge        Equipment Recommendations  None recommended by PT    Recommendations for Other Services     Frequency      Precautions / Restrictions     Pertinent Vitals/Pain Denied pain; HR 70s      Mobility  Transfers Overall transfer level: Independent Equipment used: None Ambulation/Gait Ambulation/Gait assistance: Supervision Ambulation Distance (Feet): 160 Feet Assistive device: None Gait Pattern/deviations: WFL(Within Functional Limits) Gait velocity interpretation: at or above normal speed for age/gender General Gait Details: states she feels slightly weak/unsteady due to lack of activity due to hospitalization; self-selects slower velocity, however able to appropriately incr velocity when asked    Exercises     PT Diagnosis:    PT Problem List:   PT Treatment Interventions:       PT Goals(Current goals can be found in the care plan section) Acute Rehab PT Goals PT Goal Formulation: No goals set, d/c therapy  Visit Information  Last PT Received On: 04/20/13 Assistance Needed: +1 History of Present Illness: adm with near syncope  likely related to arrhythmias (pt has had associated with UTIs in the past). Orthostatic BPs negative. PMHx macular degeneration, L1 compression fx, CAD, afib       Prior Functioning  Home Living Family/patient expects to be discharged to:: Private residence Living Arrangements: Children Available Help at Discharge: Family Type of Home: House Home Access: Level entry Home Layout: Able to live on main level with bedroom/bathroom Home Equipment: Cane - quad;Bedside commode;Walker - 2 wheels;Shower seat;Wheelchair - manual;Other (comment) (adjustable bed (doesn't use features); ) Additional Comments: lives with daughter (retired [employer] fund[employer]); uses quad cane intermittently (began using because "she wanted to" not specifically due to decr balance or pain Prior Function Level of Independence: Independent with assistive device(s) Communication Communication: No difficulties Dominant Hand: Right    Cognition  Cognition Arousal/Alertness: Awake/alert Behavior During Therapy: WFL for tasks assessed/performed Overall Cognitive Status: Within Functional Limits for tasks assessed    Extremity/Trunk Assessment Upper Extremity Assessment Upper Extremity Assessment: Defer to OT evaluation;Overall WFL for tasks assessed Lower Extremity Assessment Lower Extremity Assessment: Overall WFL for tasks assessed Cervical / Trunk Assessment Cervical / Trunk Assessment: Normal   Balance Balance Overall balance assessment: Modified Independent Standing balance support: No upper extremity supported Standing balance-Leahy Scale: Good Single Leg Stance - Right Leg: 0 (with unilateral UE support=3 sec) Single Leg Stance - Left Leg: 0 (with unilateral UE support=7 sec) Tandem Stance - Right Leg: 30 (Rt heel beyond Lt toes, not true tandem) Rhomberg - Eyes Opened: 30 Rhomberg - Eyes Closed: 30  End of Session PT - End of Session Equipment Utilized  During Treatment: Gait belt Activity Tolerance: Patient tolerated  treatment well Patient left: with call bell/phone within reach;with family/visitor present;Other (comment) (sitting EOB (as on arrival))  GP     Kristy Valencia 04/20/2013, 9:54 AM Pager [phone]

## 2013-04-20 NOTE — Progress Notes (Signed)
Spoke with MD on call he wants to give 2.5 mg of Lopressor q 5 min x3. Will follow orders and continue to monitor.    Kristy Valencia

## 2013-04-20 NOTE — Progress Notes (Signed)
Three doses of  2.5 mg Lopressor were were given at 1610,[id number],0520 and 525. HR rate is currently 140. Will continue to monitor.   Nature Vogelsang M

## 2013-04-20 NOTE — Progress Notes (Signed)
Family Medicine Teaching Service Daily Progress Note Intern Pager: [phone]  Patient name: Kristy Valencia Medical record number: [id number] Date of birth: 03/20/1920 Age: 80 y.o. Gender: female  Primary Care Provider: Elvina SidleLAUENSTEIN,KURT, MD Consultants: none Code Status: DNR/DNI  Pt Overview and Major Events to Date:  1/14: Admitted for near syncope at home and UTI  1/15: [facility]  Assessment and Plan: Kristy LahBeatrice P Kienitz is a 80 y.o. female presenting with near syncope at home. PMH is significant for recurrent UTIs, paroxysmal atrial fibrillation, and pedal edema.   #Near-syncope: Ambulated in hallway this am with no problems.  - Orthostatic vital signs: recheck today  - PT/OT evaluate and treat   - ECHO: 55-60%, Mitral valve: Mild to moderate regurgitation.  #Right sided chest pain: Resolved.   - D-dimer elevation to 1.79: CTA negative for PE   #Paroxysmal atrial fibrillation not on anticoagulation: Afib with RVR overnight. Three doses of Metoprolol IV. Rate controlled.  - 1/16 EKG: AFib with RVR  -  1/15 EKG: Sinus rhythm with PVC's  - 2D echocardiogram (previous echo in 2012 with unclear results)   #Presumed UTI: History of recurrent UTIs.   - Continue rocephin 1/14>1/15 - Keflex tomorrow.  - Urine cx: >100,000 E.coli. Pan sensitive    #Compression fracture seen on CXR: Superior endplate T12 compression fracture, age indeterminate. History of L1 compression fx. Not currently complaining of back pain, so low suspicion for acute fracture.   #Baseline memory loss- suspect dementia. Memory problems in problem list. May benefit from outpatient workup/evaluation.   #Lower extremity edema-will hold demedex reported by family as well as potassium and monitor BMET.   FEN/GI: Regular diet, SLIV  Prophylaxis: Subcutaneous heparin  Disposition: pending morning EKG and resolution of symptoms.   Subjective:  She reports to having no pain in her knees or in her side.  Patient's daughter  reports that her mother would like to go home and doesn't want to be "lazy" by being in the hospital.     Objective: Temp:  [98.1 F (36.7 C)-98.3 F (36.8 C)] 98.1 F (36.7 C) (01/16 0429) Pulse Rate:  [48-152] 152 (01/16 0439) Resp:  [17-20] 17 (01/16 0429) BP: (117-160)/(58-66) 139/65 mmHg (01/16 0429) SpO2:  [96 %-100 %] 96 % (01/16 0429) Filed Weights   04/18/13 2029 04/19/13 0556  Weight: 159 lb 13.3 oz (72.5 kg) 159 lb 8 oz (72.349 kg)   Physical Exam: General: Well-appearing elderly woman in NAD  Cardiovascular: irregular rhythm with regular rate, 2+ radial pulses  Respiratory: Nonlabored, normal rate, CTAB  Abdomen: +BS, soft, NT, ND  Extremities: Lower leg tenderness to palpation and 1+ edema bilaterally, warm and well perfused.  Skin: Multiple seborrheic keratoses on face and some on arms. No wounds or rashes noted.  Neuro: Alert and oriented to place but needs guidance with town and month. Daughter reports at baseline.   Laboratory:  Recent Labs Lab 04/18/13 1530 04/18/13 1539  WBC 12.2*  --   HGB 14.6 15.0  HCT 43.4 44.0  PLT 277  --     Recent Labs Lab 04/18/13 1539 04/19/13 0540  NA 139 140  K 4.1 4.0  CL 102 102  CO2  --  25  BUN 24* 21  CREATININE 1.10 0.77  CALCIUM  --  8.9  GLUCOSE 98 97      Recent Labs Lab 04/18/13 1833 04/19/13 0540  TROPONINI <0.30 <0.30    Imaging/Diagnostic Tests:   Myra RudeJeremy E Vivian Neuwirth, MD 04/20/2013, 8:21 AM PGY-1, [facility] Intern pager: [phone], text pages welcome

## 2013-04-20 NOTE — Progress Notes (Signed)
Patient Name: Kristy Valencia Date of Encounter: 04/20/2013   Principal Problem:   Near syncope Active Problems:   Confusion   PAF (paroxysmal atrial fibrillation)   Ectopic atrial rhythm   Ventricular bigeminy   Hypertension   SUBJECTIVE  No chest pain, sob, palpitations, recurrence of presyncope overnight.  Did have several hrs of fast afib overnight and also a 23 beat run of nsvt in setting of freq pvc's while in sinus.  Currently in sinus and w/o complaint.  CURRENT MEDS . aspirin EC  81 mg Oral Daily  . cephALEXin  500 mg Oral Q12H  . heparin  5,000 Units Subcutaneous Q8H  . sodium chloride  3 mL Intravenous Q12H   OBJECTIVE  Filed Vitals:   04/19/13 1510 04/19/13 2120 04/20/13 0429 04/20/13 0439  BP: 117/63 160/58 139/65   Pulse: 93 48 77 152  Temp:  98.3 F (36.8 C) 98.1 F (36.7 C)   TempSrc:  Oral Oral   Resp:  20 17   Height:      Weight:      SpO2:  100% 96%     Intake/Output Summary (Last 24 hours) at 04/20/13 0857 Last data filed at 04/19/13 1700  Gross per 24 hour  Intake    240 ml  Output      0 ml  Net    240 ml   Filed Weights   04/18/13 2029 04/19/13 0556  Weight: 159 lb 13.3 oz (72.5 kg) 159 lb 8 oz (72.349 kg)    PHYSICAL EXAM  General: Pleasant, NAD. Neuro: alert, oriented to place.  Doesn't remember why she's here. Moves all extremities spontaneously. Psych: Normal affect. HEENT:  Normal  Neck: Supple without bruits or JVD. Lungs:  Resp regular and unlabored, CTA. Heart: RRR no s3, s4, or murmurs. Abdomen: Soft, non-tender, non-distended, BS + x 4.  Extremities: No clubbing, cyanosis or edema. DP/PT/Radials 2+ and equal bilaterally.  Accessory Clinical Findings  CBC  Recent Labs  04/18/13 1530 04/18/13 1539  WBC 12.2*  --   HGB 14.6 15.0  HCT 43.4 44.0  MCV 92.5  --   PLT 277  --    Basic Metabolic Panel  Recent Labs  04/18/13 1539 04/19/13 0540  NA 139 140  K 4.1 4.0  CL 102 102  CO2  --  25  GLUCOSE 98  97  BUN 24* 21  CREATININE 1.10 0.77  CALCIUM  --  8.9  MG  --  2.0   Cardiac Enzymes  Recent Labs  04/18/13 1833 04/19/13 0540  TROPONINI <0.30 <0.30   D-Dimer  Recent Labs  04/18/13 1659  DDIMER 1.79*   TELE  Predominantly sinus with freq pvc's and some bigeminy.  Had a prolonged course of afib rvr and in the midst of that had 23 beats of NSVT - all apparently asymptomatic.  ECG  afib 163, pvc's, slight inflat st dep.  Radiology/Studies  Dg Chest 2 View  04/18/2013   CLINICAL DATA:  Pain.  Frequent urination.  EXAM: CHEST  2 VIEW  COMPARISON:  Two-view chest 08/21/2010.  FINDINGS: The heart size is normal. Biapical pleural parenchymal scarring is noted. Emphysematous changes are stable. A moderate-sized hiatal hernia is unchanged. Mild osteopenia is evident. Vertebral augmentation at L1 is again noted. A superior endplate fracture at T12 is new since 2008.  IMPRESSION: 1. No acute cardiopulmonary disease. 2. Moderate-sized hiatal hernia. 3. Emphysema. 4. Superior endplate T12 compression fracture is new since 2008. The lesion  is age indeterminate.   Electronically Signed   By: Kristy Pachris  Valencia M.D.   On: 04/18/2013 16:40   Ct Angio Chest Pe W/cm &/or Wo Cm  04/18/2013   CLINICAL DATA:  Pleuritic chest pain.  Elevated D-dimer.  EXAM: CT ANGIOGRAPHY CHEST WITH CONTRAST  IMPRESSION: No CT findings for pulmonary embolism.  No acute pulmonary findings.  Tortuosity and atherosclerotic calcifications involving the aorta but no focal aneurysm or dissection.  Large hiatal hernia.   Electronically Signed   By: Loralie ChampagneMark  Valencia M.D.   On: 04/18/2013 19:47   2D Echocardiogram 1.15.2015  Study Conclusions  - Left ventricle: The cavity size was normal. Wall thickness   was increased in a pattern of moderate LVH. Systolic   function was normal. The estimated ejection fraction was   in the range of 55% to 60%. Wall motion was normal; there   were no regional wall motion abnormalities. -  Mitral valve: Mild to moderate regurgitation. - Pulmonary arteries: Systolic pressure was moderately   increased. PA peak pressure: 52mm Hg (S).   ASSESSMENT AND PLAN  1.  Presyncope:  Was orthostatic in ER.  Has also had PAF since admission, however was asymptomatic during that episode this morning (likely sleeping).  She also had a 23 beat run of NSVT.  Thus multiple possible contributors to presyncope.  Echo showed nl LV fxn and troponin's nl.  2.  PAF with RVR:  She has a h/o PAF and was previously taken off of anticoagulation and bb therapy 2/2 fall risk and apparent orthostasis/hypotn.  She had PAF rvr overnight, seemingly asymptomatic.  As we cannot add anything that will dramatically impact her BP our option as limited to digoxin (prob not ideal in setting of advanced age) and an antiarrhythmic.  Will d/w Dr. Delton SeeNelson, not unreasonable to consider amiodarone given NSVT as well.  3.  NSVT/bigeminy/pvc's:  23 beats during the night.  Also w freq pvc's.  BB previously d/c'd 2/2 orthostasis.  Will consider amio.  EF nl.  No evidence of ischemia.  4.  UTI:  Abx per IM.  Pts dtr says she only ever has afib during UTI's however given that she is asymptomatic, suspect she has it more often than that.  Signed, Kristy Valencia  The patient was seen, examined and discussed with Kristy Pilarhris Berger, Valencia and agree as above.  In summary, a 80 year old female admitted with syncope, positive for orthostatics in the settings of acute infection - UTI.  She also has paroxysmal atrial fibrillation, not on anticoagulation - considering her age and h/o falls.  BB are not ideal as HR as low as 48 and also considering low BP with syncope. Overnight there was an episode of nsVT. We will start the patient on PO amiodarone load.   Kristy Valencia, Kristy Valencia, Kristy Valencia 04/20/2013

## 2013-04-20 NOTE — Evaluation (Signed)
Occupational Therapy Evaluation Patient Details Name: Kristy Valencia MRN: [id number] DOB: 1919/05/03 Today's Date: 04/20/2013 Time: 4696-2952 OT Time Calculation (min): 17 min  OT Assessment / Plan / Recommendation History of present illness adm with near syncope likely related to arrhythmias (pt has had associated with UTIs in the past). Orthostatic BPs negative. PMHx macular degeneration, L1 compression fx, CAD, afib   Clinical Impression   Pt is at Mod I level with ADLs and ADL mobility. Pt impulsive and getting OOB without assist. No further acute OT services indicated    OT Assessment  Patient does not need any further OT services    Follow Up Recommendations  No OT follow up;Supervision - Intermittent    Barriers to Discharge  none, pt lives at home with her dtr    Equipment Recommendations   none    Recommendations for Other Services    Frequency       Precautions / Restrictions Precautions Precautions: None Restrictions Weight Bearing Restrictions: No   Pertinent Vitals/Pain No c/o pain    ADL  Grooming: Performed;Modified independent Upper Body Bathing: Simulated;Modified independent Lower Body Bathing: Simulated;Modified independent Upper Body Dressing: Performed;Modified independent Lower Body Dressing: Performed;Modified independent Toilet Transfer: Performed;Modified independent Toilet Transfer Method: Sit to Barista: Regular height toilet;Grab bars Toileting - Clothing Manipulation and Hygiene: Performed;Modified independent Where Assessed - Toileting Clothing Manipulation and Hygiene: Standing Tub/Shower Transfer: Performed;Modified independent Tub/Shower Transfer Method: Science writer: Grab bars;Walk in shower Equipment Used: Gait belt    OT Diagnosis:    OT Problem List:   OT Treatment Interventions:     OT Goals(Current goals can be found in the care plan section) Acute Rehab OT Goals Patient  Stated Goal: to go home  Visit Information  Last OT Received On: 04/20/13 History of Present Illness: adm with near syncope likely related to arrhythmias (pt has had associated with UTIs in the past). Orthostatic BPs negative. 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); uses quad cane intermittently (began using because "she wanted to" not specifically due to decr balance or pain Prior Function Level of Independence: Independent with assistive device(s) Communication Communication: No difficulties Dominant Hand: Right         Vision/Perception Vision - History Baseline Vision: Wears glasses all the time Patient Visual Report: No change from baseline Perception Perception: Within Functional Limits   Cognition  Cognition Arousal/Alertness: Awake/alert Behavior During Therapy: WFL for tasks assessed/performed Overall Cognitive Status: No family/caregiver present to determine baseline cognitive functioning Memory: Decreased short-term memory    Extremity/Trunk Assessment Upper Extremity Assessment Upper Extremity Assessment: Overall WFL for tasks assessed;Generalized weakness Lower Extremity Assessment Lower Extremity Assessment: Defer to PT evaluation Cervical / Trunk Assessment Cervical / Trunk Assessment: Normal     Mobility Bed Mobility Overal bed mobility: Independent Transfers Overall transfer level: Independent Equipment used: None     Exercise     Balance Balance Overall balance assessment: Modified  Independent Standing balance support: No upper extremity supported;During functional activity Standing balance-Leahy Scale: Good   End of Session OT - End of Session Equipment  Utilized During Treatment: Gait belt Activity Tolerance: Patient tolerated treatment well Patient left: in bed;with call bell/phone within reach;with bed alarm set  GO     Galen ManilaSpencer, Walta [place] Jeanette 04/20/2013, 2:51 PM

## 2013-04-21 DIAGNOSIS — R413 Other amnesia: Secondary | ICD-10-CM

## 2013-04-21 MED ORDER — CEPHALEXIN 500 MG PO CAPS: 500.0000 mg | ORAL_CAPSULE | Freq: Two times a day (BID) | ORAL | Status: DC

## 2013-04-21 MED ORDER — AMIODARONE HCL 200 MG PO TABS: 200.0000 mg | ORAL_TABLET | Freq: Two times a day (BID) | ORAL | Status: DC

## 2013-04-21 MED ORDER — AMIODARONE HCL 200 MG PO TABS
200.0000 mg | ORAL_TABLET | Freq: Two times a day (BID) | ORAL | Status: DC
  Administered 2013-04-21: 200 mg via ORAL
  Filled 2013-04-21 (×2): qty 1

## 2013-04-21 NOTE — Progress Notes (Signed)
Received report at 1900.  Assumed care of patient.  Patient is currently sitting at nurses station; pleasantly confused.    NAD. Denies pain at this time.  Divisional activities given. Will continue to monitor.

## 2013-04-21 NOTE — Progress Notes (Signed)
I agree with the assessment completed by Christene SlatesMavis Atitsogbui, student nurse.

## 2013-04-21 NOTE — Discharge Instructions (Signed)
Ms. Kristy Valencia you were admitted for an UTI and almost passing out.  Some things that go on with your heart could contribute to this but it could also be due to the UTI. We spoke with Cardiology that recommended you follow up in 1-2 weeks with Dr. Melburn PopperNasher.    They also started you on a medication called Amiodarone. This is to help control your heart rate.  Also please only take your Torsemide every other day.  Then inform your Cardiologist about this so they may change their decision.   Please follow up with your primary doctor within a week.    Urinary Tract Infection A urinary tract infection (UTI) can occur any place along the urinary tract. The tract includes the kidneys, ureters, bladder, and urethra. A type of germ called bacteria often causes a UTI. UTIs are often helped with antibiotic medicine.  HOME CARE   If given, take antibiotics as told by your doctor. Finish them even if you start to feel better.  Drink enough fluids to keep your pee (urine) clear or pale yellow.  Avoid tea, drinks with caffeine, and bubbly (carbonated) drinks.  Pee often. Avoid holding your pee in for a long time.  Pee before and after having sex (intercourse).  Wipe from front to back after you poop (bowel movement) if you are a woman. Use each tissue only once. GET HELP RIGHT AWAY IF:   You have back pain.  You have lower belly (abdominal) pain.  You have chills.  You feel sick to your stomach (nauseous).  You throw up (vomit).  Your burning or discomfort with peeing does not go away.  You have a fever.  Your symptoms are not better in 3 days. MAKE SURE YOU:   Understand these instructions.  Will watch your condition.  Will get help right away if you are not doing well or get worse. Document Released: 09/08/2007 Document Revised: 12/15/2011 Document Reviewed: 10/21/2011 [facility] Patient Information 2014 [employer] [place].

## 2013-04-21 NOTE — Progress Notes (Signed)
Family Medicine Teaching Service Daily Progress Note Intern Pager: [phone]  Patient name: Kristy Valencia Medical record number: [id number] Date of birth: 05/07/1919 Age: 80 y.o. Gender: female  Primary Care Provider: Elvina SidleLAUENSTEIN,KURT, MD Consultants: none Code Status: DNR/DNI  Pt Overview and Major Events to Date:  1/14: Admitted for near syncope at home and UTI  1/15: ECHO, Amio started    Assessment and Plan: Kristy LahBeatrice P Calix is a 80 y.o. female presenting with near syncope at home. PMH is significant for recurrent UTIs, paroxysmal atrial fibrillation, and pedal edema.   #Near-syncope: Patient asymptomatic >48hours.   - Orthostatic vital signs: normal  - PT/OT: no further rec's   #Right sided chest pain: Resolved.   - D-dimer elevation to 1.79: CTA negative for PE   #Paroxysmal atrial fibrillation not on anticoagulation: Afib with RVR, unsustained Vtach, and not a candidate for pacemaker: rate controlled.  - amiodarone 200 mg BID   #Presumed UTI: History of recurrent UTIs.   - Continue rocephin 1/14>1/15 - Keflex 1/16; for a 10 day course  - Urine cx: >100,000 E.coli. Pan sensitive    #Compression fracture seen on CXR: Superior endplate T12 compression fracture, age indeterminate. History of L1 compression fx. Not currently complaining of back pain, so low suspicion for acute fracture.   #Baseline memory loss- suspect dementia. Memory problems in problem list. May benefit from outpatient workup/evaluation.   #Lower extremity edema-will hold demedex reported by family as well as potassium and monitor BMET.   FEN/GI: Regular diet, SLIV  Prophylaxis: Subcutaneous heparin  Disposition: pending morning EKG and resolution of symptoms.   Subjective: Patient has been asymptomatic >48 hours and she had no overnight events.   Objective: Temp:  [97.7 F (36.5 C)-98.4 F (36.9 C)] 97.7 F (36.5 C) (01/17 0635) Pulse Rate:  [60-69] 60 (01/17 0635) Resp:  [18-20] 18 (01/17  0635) BP: (131-145)/(77-89) 145/77 mmHg (01/17 0635) SpO2:  [99 %-100 %] 99 % (01/17 0635) Weight:  [161 lb 9.6 oz (73.3 kg)] 161 lb 9.6 oz (73.3 kg) (01/17 0635) Filed Weights   04/18/13 2029 04/19/13 0556 04/21/13 0635  Weight: 159 lb 13.3 oz (72.5 kg) 159 lb 8 oz (72.349 kg) 161 lb 9.6 oz (73.3 kg)   Physical Exam: General: Well-appearing elderly woman in NAD  Cardiovascular: irregular rhythm with regular rate, 2+ radial pulses  Respiratory: Nonlabored, normal rate, CTAB  Abdomen: +BS, soft, NT, ND  Extremities: Lower leg tenderness to palpation and 1+ edema bilaterally, warm and well perfused.  Skin: Multiple seborrheic keratoses on face and some on arms. No wounds or rashes noted.  Neuro: Alert and oriented to place but needs guidance with town and month.  Laboratory:  Recent Labs Lab 04/18/13 1530 04/18/13 1539  WBC 12.2*  --   HGB 14.6 15.0  HCT 43.4 44.0  PLT 277  --     Recent Labs Lab 04/18/13 1539 04/19/13 0540  NA 139 140  K 4.1 4.0  CL 102 102  CO2  --  25  BUN 24* 21  CREATININE 1.10 0.77  CALCIUM  --  8.9  GLUCOSE 98 97      Recent Labs Lab 04/18/13 1833 04/19/13 0540  TROPONINI <0.30 <0.30    Imaging/Diagnostic Tests:   Myra RudeJeremy E Schmitz, MD 04/21/2013, 8:29 AM PGY-1, [facility]PT[facility] Intern pager: [phone], text pages welcome

## 2013-04-21 NOTE — Progress Notes (Signed)
Shift Summary:  No acute events this shift.  Patient has been afebrile; VSS,  Denied pain this shift.  Patient has been ambulating in hall and sitting at nurse's station this shift; activity tolerated well.  Patient has been confused throughout the shift; reorientation and divisional activities provided.  Patient slept well throughout the shift.  Will continue to monitor.

## 2013-04-21 NOTE — Progress Notes (Signed)
    Primary cardiologist: Dr. Kristeen MissPhilip Valencia  Subjective:    Feels better today, a full breakfast. Daughter is here with her, has walked in the hall.  Objective:   Temp:  [97.7 F (36.5 C)-98.4 F (36.9 C)] 97.7 F (36.5 C) (01/17 0635) Pulse Rate:  [60-69] 60 (01/17 0635) Resp:  [18-20] 18 (01/17 0635) BP: (131-145)/(77-89) 145/77 mmHg (01/17 0635) SpO2:  [99 %-100 %] 99 % (01/17 0635) Weight:  [161 lb 9.6 oz (73.3 kg)] 161 lb 9.6 oz (73.3 kg) (01/17 0635) Last BM Date: 04/19/13  Filed Weights   04/18/13 2029 04/19/13 0556 04/21/13 [id number]  Weight: 159 lb 13.3 oz (72.5 kg) 159 lb 8 oz (72.349 kg) 161 lb 9.6 oz (73.3 kg)    Intake/Output Summary (Last 24 hours) at 04/21/13 0828 Last data filed at 04/20/13 1700  Gross per 24 hour  Intake    720 ml  Output      0 ml  Net    720 ml    Telemetry: Sinus rhythm with occasional PVCs.  Exam:  General: No distress.  Lungs: Clear, nonlabored.  Cardiac: RRR with occasional ectopic beat, soft systolic murmur, no gallop.  Extremities: No pitting edema.   Lab Results:  Basic Metabolic Panel:  Recent Labs Lab 04/18/13 1539 04/19/13 0540  NA 139 140  K 4.1 4.0  CL 102 102  CO2  --  25  GLUCOSE 98 97  BUN 24* 21  CREATININE 1.10 0.77  CALCIUM  --  8.9  MG  --  2.0    CBC:  Recent Labs Lab 04/18/13 1530 04/18/13 1539  WBC 12.2*  --   HGB 14.6 15.0  HCT 43.4 44.0  MCV 92.5  --   PLT 277  --     Echocardiogram: Study Conclusions  - Left ventricle: The cavity size was normal. Wall thickness was increased in a pattern of moderate LVH. Systolic function was normal. The estimated ejection fraction was in the range of 55% to 60%. Wall motion was normal; there were no regional wall motion abnormalities. - Mitral valve: Mild to moderate regurgitation. - Pulmonary arteries: Systolic pressure was moderately increased. PA peak pressure: 52mm Hg (S).    Medications:   Scheduled Medications: . amiodarone  400  mg Oral BID  . aspirin EC  81 mg Oral Daily  . cephALEXin  500 mg Oral Q12H  . heparin  5,000 Units Subcutaneous Q8H  . sodium chloride  3 mL Intravenous Q12H     Infusions: . sodium chloride 15 mL/hr at 04/18/13 1705     PRN Medications:  acetaminophen, acetaminophen   Assessment:   1. Presyncope likely secondary to orthostasis. Taken off beta blocker as well secondary to bradycardia. Recent blood pressures are stable. She has symptomatically improved.  2. PAF. Not optimal candidate for anticoagulation.  3. NSVT with normal LVEF by echocardiogram.  4. UTI, per primary team.   Plan/Discussion:    Patient started on amiodarone by Dr. Delton SeeNelson. She is under the impression that she may be going home later today per primary team. Would recommend reducing amiodarone to 200 mg twice daily, continue aspirin. She will stay off beta blocker. She will need to have an office visit with Dr. Elease HashimotoNahser or associate in 1-2 weeks. Can determine at that time whether she needs to continue long-term amiodarone or not.   [name], M.D., F.A.C.C.

## 2013-04-21 NOTE — Progress Notes (Signed)
FMTS Attending Note   I personally saw and evaluated the patient. The plan of care was discussed with the resident team. I agree with the assessment and plan as documented by the resident.   Patient evaluated in hospital room, daughter is present, no acute events overnight, no chest pain, no SOB, patient continues to be pleasantly confused   1. Presyncopal Episode - suspect orthostasis vs cardiac cause, she has had multiple abnormal rhythms on telemetry including Afib with RVR, bradycardia, PVC's and non-sustained Vtach, Cardiology is aware of these findings and initiated Amiodarone, she has been stable overnight with this regimen  2. Paroxysmal Atrial Fibrillation - not a candidate for anticoagluation given fall risk, Amiodarone initiated by Cardiology, off Metoprolol due to hypotension, cardiology recommending holding BB at time of discharge  3. Right sided chest pain resolved - likely contusion due to presyncopal episode  4. UTI - agree with therapy as outlined in resident note   [facility]: [facility], discuss discharge with Cardiology  Donnella ShamKyle Tarig Zimmers MD

## 2013-04-22 NOTE — Discharge Summary (Signed)
Family Medicine Teaching [facility] Discharge Summary  Patient name: Kristy Valencia Medical record number: [id number] Date of birth: 03-22-1920 Age: 80 y.o. Gender: female Date of Admission: 04/18/2013  Date of Discharge: 04/21/13 Admitting Physician: Nestor Ramp, MD  Primary Care Provider: Elvina Sidle, MD Consultants: Cardiology   Indication for Hospitalization: Near Syncope   Discharge Diagnoses/Problem List:  Patient Active Problem List   Diagnosis Date Noted  . Ectopic atrial rhythm 04/20/2013  . Ventricular bigeminy 04/20/2013  . NSVT (nonsustained ventricular tachycardia) 04/20/2013  . Hypertension   . Confusion 04/19/2013  . Near syncope 04/18/2013  . Macular degeneration, left eye 08/13/2011  . Intermittent memory loss 08/13/2011  . Unsteady gait 08/13/2011  . PAF (paroxysmal atrial fibrillation) 09/18/2010    Disposition: home   Discharge Condition: improved   Discharge Exam:  General: Well-appearing elderly woman in NAD  Cardiovascular: irregular rhythm with regular rate, 2+ radial pulses  Respiratory: Nonlabored, normal rate, CTAB  Abdomen: +BS, soft, NT, ND  Extremities: Lower leg tenderness to palpation and 1+ edema bilaterally, warm and well perfused.  Skin: Multiple seborrheic keratoses on face and some on arms. No wounds or rashes noted.  Neuro: Alert and oriented to place but needs guidance with town and month.  Brief Hospital Course:  Kristy Valencia is a 80 y.o. female presenting with near syncope at home. PMH is significant for recurrent UTIs, paroxysmal atrial fibrillation, and pedal edema.   #Near-syncope: Patient presented with no focal neurological findings to suggest a CVA or TIA.  She did have positive orthostatics in the ED and an EKG showed bigeminy which was changed from EKG's found in 2013.  ECHO showed an EF 55-60% with mitral valve having mild to moderate regurgitation.  She was asymptomatic during admission and PT and OT had no  further recommendations.  Suspected that her initial symptoms were attributed to either orthostasis or arrythmia.  Patient was able to ambulate in the hallway with the help of her daughter prior to discharge.   #Right sided chest pain: Found to have an elevated d-dimer on admission.  A CTA was negative for PE.  Thought that the pain was attributed to her leaning against the refrigerator in her house prior to laying on the ground.  This pain resolved prior to discharge.    #Paroxysmal atrial fibrillation: She was not on any anticoagulation and taking only an aspirin daily.  She was previously on xarelto but was discontinued presumably for bleeding risk outweigh stroke prevention. EKG on 1/15 showed Sinus rhythm with PVC's but overnight she displayed Afib with RVR and unsustained Vtach.  She was sleeping and symptomatic during this.  She received three doses of IV metoprolol.  An EKG showed Afib with RVR.  She also had some bradycardia (48) and tachycardia (152) but cardiology advised that she was not a candidate for a pacemaker.  Amiodarone 400 mg BID was initiated and her rate was controlled. She was transitioned on amiodarone 200 mg BID and prescribed this on discharged.   #Presumed UTI: She has a history of recurrent UTIs.  She was not complaining of any dysuria or urinary type symptoms.  She was started on treatment based on her UA and leukocytosis.  Urine culture grew >100,000 and was pan sensitive.  She received two days of Rocephin and transitioned to keflex to complete a 10 day course.   #Compression fracture seen on CXR: Superior endplate T12 compression fracture, age indeterminate. History of L1 compression fx. Not currently complaining of  back pain, so low suspicion for acute fracture.   #Baseline memory loss- suspect dementia. Memory problems in problem list. May benefit from outpatient workup/evaluation.   #Lower extremity edema- Patient's torsemide was held upon admission. She gained two  pounds during admission.  Thought that orthostasis may have contributed to her near syncope so torsemide dosing was changed to every other day. This could be further adjusted as outpatient.    Issues for Follow Up:  - Patient should take Torsemide every other day until follow up.  This is due to her near syncope could have been associated with orthostasis.  - Patient baseline mentation could use further investigation if patient desires.   Significant Procedures: ECHO  Significant Labs and Imaging:   Recent Labs Lab 04/18/13 1530 04/18/13 1539  WBC 12.2*  --   HGB 14.6 15.0  HCT 43.4 44.0  PLT 277  --     Recent Labs Lab 04/18/13 1539 04/19/13 0540  NA 139 140  K 4.1 4.0  CL 102 102  CO2  --  25  GLUCOSE 98 97  BUN 24* 21  CREATININE 1.10 0.77  CALCIUM  --  8.9  MG  --  2.0      Recent Labs Lab 04/18/13 1833 04/19/13 0540  TROPONINI <0.30 <0.30   Urinalysis    Component Value Date/Time   COLORURINE YELLOW 04/18/2013 1651   APPEARANCEUR TURBID* 04/18/2013 1651   LABSPEC 1.011 04/18/2013 1651   PHURINE 6.5 04/18/2013 1651   GLUCOSEU NEGATIVE 04/18/2013 1651   HGBUR SMALL* 04/18/2013 1651   BILIRUBINUR NEGATIVE 04/18/2013 1651   BILIRUBINUR NEG 06/21/2011 1220   KETONESUR NEGATIVE 04/18/2013 1651   PROTEINUR NEGATIVE 04/18/2013 1651   UROBILINOGEN 0.2 04/18/2013 1651   UROBILINOGEN 0.2 06/21/2011 1220   NITRITE NEGATIVE 04/18/2013 1651   NITRITE NEG 06/21/2011 1220   LEUKOCYTESUR LARGE* 04/18/2013 1651    04/18/2013 16:59   D-Dimer, Quant  1.79 (H)   Prothrombin Time  12.6   INR  0.96   APTT  26    BNP: 1522  CXR 2v  FINDINGS:  The heart size is normal. Biapical pleural parenchymal scarring is  noted. Emphysematous changes are stable. A moderate-sized hiatal  hernia is unchanged. Mild osteopenia is evident. Vertebral  augmentation at L1 is again noted. A superior endplate fracture at  T12 is new since 2008.  IMPRESSION:  1. No acute cardiopulmonary disease.   2. Moderate-sized hiatal hernia.  3. Emphysema.  4. Superior endplate T12 compression fracture is new since 2008. The  lesion is age indeterminate.  CT ANGIOGRAPHY CHEST WITH CONTRAST 1/14  - No CT findings for pulmonary embolism.  - No acute pulmonary findings.  - Tortuosity and atherosclerotic calcifications involving the aorta but no focal aneurysm or dissection.  - Large hiatal hernia.   Results/Tests Pending at Time of Discharge: none  Discharge Medications:    Medication List         amiodarone 200 MG tablet  Commonly known as:  PACERONE  Take 1 tablet (200 mg total) by mouth 2 (two) times daily.     aspirin 81 MG tablet  Take 81 mg by mouth daily.     cephALEXin 500 MG capsule  Commonly known as:  KEFLEX  Take 1 capsule (500 mg total) by mouth every 12 (twelve) hours.     potassium chloride SA 20 MEQ tablet  Commonly known as:  K-DUR,KLOR-CON  Take 1 tablet (20 mEq total) by mouth daily.  torsemide 20 MG tablet  Commonly known as:  DEMADEX  Take 20 mg by mouth daily.        Discharge Instructions: Please refer to Patient Instructions section of EMR for full details.  Patient was counseled important signs and symptoms that should prompt return to medical care, changes in medications, dietary instructions, activity restrictions, and follow up appointments.   Follow-Up Appointments: Follow-up Information   Follow up with Elvina SidleLAUENSTEIN,KURT, MD. (Please schedule a follow up within one week. )    Specialty:  Family Medicine   Contact information:   [address] [place] [address] [phone]       Follow up with Elyn AquasPhilip Nahser Jr., MD. (Please schedule an appointment in 1-2 weeks. )    Specialty:  Cardiology   Contact information:   [address] [place] [address] [phone]       Myra RudeJeremy E Rogerick Baldwin, MD 04/22/2013, 5:28 PM PGY-1, [facility]

## 2013-04-23 NOTE — Discharge Summary (Signed)
I agree with the discharge summary as documented.   Kassie Keng MD  

## 2013-05-06 ENCOUNTER — Ambulatory Visit (INDEPENDENT_AMBULATORY_CARE_PROVIDER_SITE_OTHER): Payer: Medicare Other | Admitting: Family Medicine

## 2013-05-06 VITALS — BP 124/60 | HR 57 | Temp 98.1°F | Resp 16 | Ht 67.0 in | Wt 160.0 lb

## 2013-05-06 DIAGNOSIS — N390 Urinary tract infection, site not specified: Secondary | ICD-10-CM

## 2013-05-06 DIAGNOSIS — R001 Bradycardia, unspecified: Secondary | ICD-10-CM

## 2013-05-06 DIAGNOSIS — I4891 Unspecified atrial fibrillation: Secondary | ICD-10-CM

## 2013-05-06 DIAGNOSIS — I498 Other specified cardiac arrhythmias: Secondary | ICD-10-CM

## 2013-05-06 LAB — POCT URINALYSIS DIPSTICK
Bilirubin, UA: NEGATIVE
Blood, UA: NEGATIVE
Glucose, UA: NEGATIVE
Ketones, UA: NEGATIVE
Nitrite, UA: NEGATIVE
Protein, UA: NEGATIVE
Spec Grav, UA: 1.015
Urobilinogen, UA: 0.2
pH, UA: 7

## 2013-05-06 LAB — POCT UA - MICROSCOPIC ONLY
Casts, Ur, LPF, POC: NEGATIVE
Crystals, Ur, HPF, POC: NEGATIVE
Mucus, UA: NEGATIVE
Yeast, UA: NEGATIVE

## 2013-05-06 NOTE — Progress Notes (Addendum)
Subjective:    Patient ID: Kristy Valencia, female    DOB: May 18, 1919, 80 y.o.   MRN: [id number]  HPI This chart was scribed for Kenyon Ana Lauenstein-MD by Smiley Houseman, Scribe. This patient was seen in room 10 and the patient's care was started at 8:20 AM.  HPI Comments: Kristy Valencia is a 80 y.o. female with a h/o of PAF who presents to the [facility] for a hospitalization follow-up.  Pt was hospitalized on 04/18/13 for a urinary tract infection.  Pt is on amiodarone, but relative states they are visiting the cardiologist to reduce the dosage.  Relative states that the pt was walking in the kitchen, when she slid down the refrigerator, because she was so weak she couldn't walk.  Pt denies dizziness.  Pt's relative states that the pt only wants to eat Oreo cookies and granola bars, so she is having diarrhea quite often.     Past Surgical History  Procedure Laterality Date   Transthoracic echocardiogram  08/24/10    Left ventricle with wall thickness demonstrating moderate LVH.  Systolic function was decreased with   an estimated ejection fraction of 65-70%.  Pulmonary artery peak  pressure of 34 mmHg   Chest x-ray  08/21/2010    Cardiomegaly with mild edema   Breast biopsy      X2   Appendectomy     Abdominal hysterectomy      Family History  Problem Relation Age of Onset   Stomach cancer Father     History   Social History   Marital Status: Widowed    Spouse Name: N/A    Number of Children: N/A   Years of Education: N/A   Occupational History   Retired Theatre manager   Social History Main Topics   Smoking status: Never Smoker    Smokeless tobacco: Not on file   Alcohol Use: No   Drug Use: No   Sexual Activity: No   Other Topics Concern   Not on file   Social History Narrative   Lives with daughter    Allergies  Allergen Reactions   Codeine Nausea And Vomiting and Other (See Comments)    Neurological problems-dizziness,  altered mental state   Procardia [Nifedipine] Other (See Comments)    CARDIAC ARREST   Penicillins Other (See Comments)    Unknown - Rec'd Rocephin in the ED    Sulfa Drugs Cross Reactors Other (See Comments)    Unknown    Azithromycin Hives, Itching and Rash    ALL MYCINS    Patient Active Problem List   Diagnosis Date Noted   Ectopic atrial rhythm 04/20/2013   Ventricular bigeminy 04/20/2013   NSVT (nonsustained ventricular tachycardia) 04/20/2013   Hypertension    Confusion 04/19/2013   Near syncope 04/18/2013   Macular degeneration, left eye 08/13/2011   Intermittent memory loss 08/13/2011   Unsteady gait 08/13/2011   PAF (paroxysmal atrial fibrillation) 09/18/2010    Review of Systems  Constitutional: Negative for fever and chills.  HENT: Negative for congestion, rhinorrhea and sore throat.   Respiratory: Negative for cough and shortness of breath.   Cardiovascular: Negative for chest pain.  Gastrointestinal: Positive for diarrhea. Negative for nausea, vomiting and abdominal pain.  Musculoskeletal: Negative for back pain.  Skin: Negative for color change and rash.  Neurological: Negative for dizziness and syncope.       Objective:   Physical Exam No acute distress, somewhat confused Neck: Supple no adenopathy  Chest: Clear Heart: Regular but pulse is below 50 extremities: No edema  Triage Vitals: BP 124/60   Pulse 57   Temp(Src) 98.1 F (36.7 C) (Oral)   Resp 16   Ht 5\' 7"  (1.702 m)   Wt 160 lb (72.576 kg)   BMI 25.05 kg/m2   SpO2 100%  Patient unable to provide a urine specimen Results for orders placed in visit on 05/06/13  POCT UA - MICROSCOPIC ONLY      Result Value Range   WBC, Ur, HPF, POC 0-2     RBC, urine, microscopic 0-1     Bacteria, U Microscopic trace     Mucus, UA neg     Epithelial cells, urine per micros 1-3     Crystals, Ur, HPF, POC neg     Casts, Ur, LPF, POC neg     Yeast, UA neg    POCT URINALYSIS DIPSTICK       Result Value Range   Color, UA yellow     Clarity, UA clear     Glucose, UA neg     Bilirubin, UA neg     Ketones, UA neg     Spec Grav, UA 1.015     Blood, UA neg     pH, UA 7.0     Protein, UA neg     Urobilinogen, UA 0.2     Nitrite, UA neg     Leukocytes, UA Trace         Assessment & Plan:   Patient to provide urine specimen after leaving I have asked patient to reduce the amiodarone to once a day because of the bradycardia and intermittent weakness and she's been experiencing at home.  Signed, Sheila OatsKurt Lauenstein M.D.

## 2013-05-08 ENCOUNTER — Ambulatory Visit (INDEPENDENT_AMBULATORY_CARE_PROVIDER_SITE_OTHER): Payer: Medicare Other | Admitting: Nurse Practitioner

## 2013-05-08 ENCOUNTER — Encounter: Payer: Self-pay | Admitting: Nurse Practitioner

## 2013-05-08 VITALS — BP 150/70 | HR 52 | Ht 66.5 in | Wt 160.1 lb

## 2013-05-08 DIAGNOSIS — Z79899 Other long term (current) drug therapy: Secondary | ICD-10-CM

## 2013-05-08 DIAGNOSIS — I4891 Unspecified atrial fibrillation: Secondary | ICD-10-CM

## 2013-05-08 DIAGNOSIS — I480 Paroxysmal atrial fibrillation: Secondary | ICD-10-CM

## 2013-05-08 LAB — CBC
HCT: 40.3 % (ref 36.0–46.0)
Hemoglobin: 13.1 g/dL (ref 12.0–15.0)
MCHC: 32.5 g/dL (ref 30.0–36.0)
MCV: 93.1 fl (ref 78.0–100.0)
Platelets: 342 10*3/uL (ref 150.0–400.0)
RBC: 4.33 Mil/uL (ref 3.87–5.11)
RDW: 14.1 % (ref 11.5–14.6)
WBC: 7 10*3/uL (ref 4.5–10.5)

## 2013-05-08 LAB — BASIC METABOLIC PANEL
BUN: 16 mg/dL (ref 6–23)
CO2: 27 mEq/L (ref 19–32)
Calcium: 9.3 mg/dL (ref 8.4–10.5)
Chloride: 101 mEq/L (ref 96–112)
Creatinine, Ser: 0.8 mg/dL (ref 0.4–1.2)
GFR: 71.29 mL/min (ref >60.00)
Glucose, Bld: 81 mg/dL (ref 70–99)
Potassium: 4.5 mEq/L (ref 3.5–5.1)
Sodium: 134 mEq/L — ABNORMAL LOW (ref 135–145)

## 2013-05-08 LAB — TSH: TSH: 1.91 u[IU]/mL (ref 0.35–5.50)

## 2013-05-08 NOTE — Patient Instructions (Signed)
We need to check labs today and check an EKG  See Dr. Elease HashimotoNahser in 3 to 4 months  Stay on your current medicines  Call the [facility] office at [phone] if you have any questions, problems or concerns.

## 2013-05-08 NOTE — Progress Notes (Addendum)
Garen LahBeatrice P Lajara Date of Birth: 01/11/1920 Medical Record #[id number]  History of Present Illness: Ms. Pernell Dupredams is seen back today for a post hospital visit. Seen for Dr. Elease HashimotoNahser. She is a 80 year old female with PAF, chronic UTIs, past TB, OA, GERD, HTN and on chronic anticoagulation and was previously treated with Xarelto.   Has not been seen here in the office since March of 2013.  Most recently in the hospital with near syncope. No findings to suggest CVA or TIA. Was orthostatic and EKG showed bigeminy. Echo showed EF of 55 to 60% with mild to moderate MR. Did have an elevated d dimer and CTA was negative for PE. Did have PAF with RVR and NSVT - also noted to have some tachy brady but seen by cardiology and PPM not felt to be advised. Was placed on amiodarone. Did have a recurrent UTI. Also with some memory issues and felt to have some dementia. Her Torsemide was cut back to QOD due to her orthostasis. She is not felt to be a good candidate for anticoagulation and was treated with aspirin only.  Comes back today. Here with family. Doing fine now. No more problems. Not dizzy or lightheaded. No recent falls but remains unsteady. Family thinks she is doing well. Amiodarone cut back her PCP this weekend due to poor appetite and lower HR. No chest pain. Some swelling but that is normal for her.    Current Outpatient Prescriptions  Medication Sig Dispense Refill  . amiodarone (PACERONE) 200 MG tablet Take 200 mg by mouth daily.      Marland Kitchen. aspirin 81 MG tablet Take 81 mg by mouth daily.      . potassium chloride SA (K-DUR,KLOR-CON) 20 MEQ tablet Take 1 tablet (20 mEq total) by mouth daily.  30 tablet  5  . torsemide (DEMADEX) 20 MG tablet Take 20 mg by mouth daily.       No current facility-administered medications for this visit.    Allergies  Allergen Reactions  . Codeine Nausea And Vomiting and Other (See Comments)    Neurological problems-dizziness, altered mental state  . Procardia [Nifedipine]  Other (See Comments)    CARDIAC ARREST  . Penicillins Other (See Comments)    Unknown - Rec'd Rocephin in the ED   . Sulfa Drugs Cross Reactors Other (See Comments)    Unknown   . Azithromycin Hives, Itching and Rash    ALL MYCINS    Past Medical History  Diagnosis Date  . Abdominal pain     LOWER  . PAF (paroxysmal atrial fibrillation)   . Dysuria   . TB (tuberculosis)   . OA (osteoarthritis)   . GERD (gastroesophageal reflux disease)   . Hypertension   . Macular degeneration   . Compression fracture of L1 lumbar vertebra   . Chest tightness   . VT (ventricular tachycardia)   . Chronic anticoagulation     a. previously xarelto then coumadin-->d/c'd by pcp 2/2 fall risk.  . Cataract     Past Surgical History  Procedure Laterality Date  . Transthoracic echocardiogram  08/24/10    Left ventricle with wall thickness demonstrating moderate LVH.  Systolic function was decreased with   an estimated ejection fraction of 65-70%.  Pulmonary artery peak  pressure of 34 mmHg  . Chest x-ray  08/21/2010    Cardiomegaly with mild edema  . Breast biopsy      X2  . Appendectomy    . Abdominal hysterectomy  History  Smoking status  . Never Smoker   Smokeless tobacco  . Not on file    History  Alcohol Use No    Family History  Problem Relation Age of Onset  . Stomach cancer Father     Review of Systems: The review of systems is per the HPI.  All other systems were reviewed and are negative.  Physical Exam: BP 150/70  Pulse 52  Ht 5' 6.5" (1.689 m)  Wt 160 lb 1.9 oz (72.63 kg)  BMI 25.46 kg/m2  SpO2 100% Patient is very pleasant and in no acute distress. Skin is warm and dry. Color is normal.  HEENT is unremarkable. Normocephalic/atraumatic. PERRL. Sclera are nonicteric. Neck is supple. No masses. No JVD. Lungs are clear. Cardiac exam shows a regular rate and rhythm. Heart rate is 60 by me. Abdomen is soft. Extremities are without edema. Gait and ROM are intact. No  gross neurologic deficits noted.  Wt Readings from Last 3 Encounters:  05/08/13 160 lb 1.9 oz (72.63 kg)  05/06/13 160 lb (72.576 kg)  04/21/13 161 lb 9.6 oz (73.3 kg)     LABORATORY DATA: PENDING  Lab Results  Component Value Date   WBC 12.2* 04/18/2013   HGB 15.0 04/18/2013   HCT 44.0 04/18/2013   PLT 277 04/18/2013   GLUCOSE 97 04/19/2013   ALT 10 03/15/2013   AST 18 03/15/2013   NA 140 04/19/2013   K 4.0 04/19/2013   CL 102 04/19/2013   CREATININE 0.77 04/19/2013   BUN 21 04/19/2013   CO2 25 04/19/2013   INR 0.96 04/18/2013     Assessment / Plan: 1. PAF with NSVT noted on recent admission - now on low dose amiodarone - I would continue for now. Does need TSH checked. See her back in 3 to 4 months. Not a candidate for anticoagulation other than aspirin.   2. Tachy brady - not felt to be a candidate for PPM - on low dose amiodarone and tolerating without issue at present. She is no longer symptomatic.   3. Recurrent UTI - always exacerbates her arrhythmia  4. Near syncope - resolved   5. Advanced age  Patient is agreeable to this plan and will call if any problems develop in the interim.   Rosalio Macadamia, RN, ANP-C [facility] [address] [place], [place]  [address] [phone]   EKG today shows sinus bradycardia - rate of 50.

## 2013-06-13 ENCOUNTER — Emergency Department (HOSPITAL_COMMUNITY): Payer: Medicare Other

## 2013-06-13 ENCOUNTER — Emergency Department (HOSPITAL_COMMUNITY)
Admission: EM | Admit: 2013-06-13 | Discharge: 2013-07-04 | Disposition: E | Payer: Medicare Other | Attending: Emergency Medicine | Admitting: Emergency Medicine

## 2013-06-13 ENCOUNTER — Encounter (HOSPITAL_COMMUNITY): Payer: Self-pay | Admitting: Emergency Medicine

## 2013-06-13 DIAGNOSIS — R000 Tachycardia, unspecified: Secondary | ICD-10-CM | POA: Insufficient documentation

## 2013-06-13 DIAGNOSIS — H3530 Unspecified macular degeneration: Secondary | ICD-10-CM | POA: Insufficient documentation

## 2013-06-13 DIAGNOSIS — R4189 Other symptoms and signs involving cognitive functions and awareness: Secondary | ICD-10-CM

## 2013-06-13 DIAGNOSIS — Z880 Allergy status to penicillin: Secondary | ICD-10-CM | POA: Insufficient documentation

## 2013-06-13 DIAGNOSIS — H269 Unspecified cataract: Secondary | ICD-10-CM | POA: Insufficient documentation

## 2013-06-13 DIAGNOSIS — Z8611 Personal history of tuberculosis: Secondary | ICD-10-CM | POA: Insufficient documentation

## 2013-06-13 DIAGNOSIS — Z8781 Personal history of (healed) traumatic fracture: Secondary | ICD-10-CM | POA: Insufficient documentation

## 2013-06-13 DIAGNOSIS — Z79899 Other long term (current) drug therapy: Secondary | ICD-10-CM | POA: Insufficient documentation

## 2013-06-13 DIAGNOSIS — I10 Essential (primary) hypertension: Secondary | ICD-10-CM | POA: Insufficient documentation

## 2013-06-13 DIAGNOSIS — Z7982 Long term (current) use of aspirin: Secondary | ICD-10-CM | POA: Insufficient documentation

## 2013-06-13 DIAGNOSIS — R404 Transient alteration of awareness: Secondary | ICD-10-CM | POA: Insufficient documentation

## 2013-06-13 DIAGNOSIS — M1990 Unspecified osteoarthritis, unspecified site: Secondary | ICD-10-CM | POA: Insufficient documentation

## 2013-06-13 LAB — CBC WITH DIFFERENTIAL/PLATELET
Basophils Absolute: 0 10*3/uL (ref 0.0–0.1)
Basophils Relative: 0 % (ref 0–1)
EOS ABS: 0 10*3/uL (ref 0.0–0.7)
EOS PCT: 0 % (ref 0–5)
HCT: 40.9 % (ref 36.0–46.0)
Hemoglobin: 14.1 g/dL (ref 12.0–15.0)
LYMPHS ABS: 1.9 10*3/uL (ref 0.7–4.0)
Lymphocytes Relative: 11 % — ABNORMAL LOW (ref 12–46)
MCH: 31.8 pg (ref 26.0–34.0)
MCHC: 34.5 g/dL (ref 30.0–36.0)
MCV: 92.1 fL (ref 78.0–100.0)
Monocytes Absolute: 1.8 10*3/uL — ABNORMAL HIGH (ref 0.1–1.0)
Monocytes Relative: 10 % (ref 3–12)
Neutro Abs: 14.1 10*3/uL — ABNORMAL HIGH (ref 1.7–7.7)
Neutrophils Relative %: 79 % — ABNORMAL HIGH (ref 43–77)
Platelets: 269 10*3/uL (ref 150–400)
RBC: 4.44 MIL/uL (ref 3.87–5.11)
RDW: 13.7 % (ref 11.5–15.5)
WBC: 17.8 10*3/uL — ABNORMAL HIGH (ref 4.0–10.5)

## 2013-06-13 LAB — LACTIC ACID, PLASMA: LACTIC ACID, VENOUS: 2 mmol/L (ref 0.5–2.2)

## 2013-06-13 LAB — I-STAT CHEM 8, ED
BUN: 31 mg/dL — ABNORMAL HIGH (ref 6–23)
CALCIUM ION: 1.14 mmol/L (ref 1.13–1.30)
CREATININE: 0.7 mg/dL (ref 0.50–1.10)
Chloride: 102 mEq/L (ref 96–112)
Glucose, Bld: 157 mg/dL — ABNORMAL HIGH (ref 70–99)
HCT: 43 % (ref 36.0–46.0)
Hemoglobin: 14.6 g/dL (ref 12.0–15.0)
POTASSIUM: 3.7 meq/L (ref 3.7–5.3)
Sodium: 139 mEq/L (ref 137–147)
TCO2: 28 mmol/L (ref 0–100)

## 2013-06-13 LAB — COMPREHENSIVE METABOLIC PANEL
ALK PHOS: 95 U/L (ref 39–117)
ALT: 19 U/L (ref 0–35)
AST: 42 U/L — ABNORMAL HIGH (ref 0–37)
Albumin: 3.1 g/dL — ABNORMAL LOW (ref 3.5–5.2)
BUN: 23 mg/dL (ref 6–23)
CO2: 25 meq/L (ref 19–32)
Calcium: 8.9 mg/dL (ref 8.4–10.5)
Chloride: 99 mEq/L (ref 96–112)
Creatinine, Ser: 0.55 mg/dL (ref 0.50–1.10)
GFR, EST NON AFRICAN AMERICAN: 79 mL/min — AB (ref >90)
GLUCOSE: 157 mg/dL — AB (ref 70–99)
POTASSIUM: 4.3 meq/L (ref 3.7–5.3)
Sodium: 138 mEq/L (ref 137–147)
TOTAL PROTEIN: 6.9 g/dL (ref 6.0–8.3)
Total Bilirubin: 0.2 mg/dL — ABNORMAL LOW (ref 0.3–1.2)

## 2013-06-14 ENCOUNTER — Telehealth (HOSPITAL_COMMUNITY): Payer: Self-pay

## 2013-07-04 DIAGNOSIS — 419620001 Death: Secondary | SNOMED CT | POA: Insufficient documentation

## 2013-07-04 NOTE — ED Provider Notes (Signed)
CSN: [id number]     Arrival date & time 06/28/2013  1322 History   First MD Initiated Contact with Patient 003/26/2015 1336     Chief Complaint  Patient presents with  . Altered Mental Status     (Consider location/radiation/quality/duration/timing/severity/associated sxs/prior Treatment) HPI Comments: 80 year old female presenting unresponsive. Her daughter found her this afternoon in his unresponsive state. She was last seen normal yesterday.  Level V caveat  Patient is a 80 y.o. female presenting with altered mental status.  Altered Mental Status Presenting symptoms: unresponsiveness   Severity:  Severe Most recent episode:  Today Episode history:  Single Duration: Unknown. Timing:  Constant Progression:  Unchanged Chronicity:  New   Past Medical History  Diagnosis Date  . Abdominal pain     LOWER  . PAF (paroxysmal atrial fibrillation)   . Dysuria   . TB (tuberculosis)   . OA (osteoarthritis)   . GERD (gastroesophageal reflux disease)   . Hypertension   . Macular degeneration   . Compression fracture of L1 lumbar vertebra   . Chest tightness   . VT (ventricular tachycardia)   . Chronic anticoagulation     a. previously xarelto then coumadin-->d/c'd by pcp 2/2 fall risk.  . Cataract    Past Surgical History  Procedure Laterality Date  . Transthoracic echocardiogram  08/24/10    Left ventricle with wall thickness demonstrating moderate LVH.  Systolic function was decreased with   an estimated ejection fraction of 65-70%.  Pulmonary artery peak  pressure of 34 mmHg  . Chest x-ray  08/21/2010    Cardiomegaly with mild edema  . Breast biopsy      X2  . Appendectomy    . Abdominal hysterectomy     Family History  Problem Relation Age of Onset  . Stomach cancer Father    History  Substance Use Topics  . Smoking status: Never Smoker   . Smokeless tobacco: Not on file  . Alcohol Use: No   OB History   Grav Para Term Preterm Abortions TAB SAB Ect Mult Living                  Review of Systems  Unable to perform ROS     Allergies  Codeine; Procardia; Penicillins; Sulfa drugs cross reactors; and Azithromycin  Home Medications   Current Outpatient Rx  Name  Route  Sig  Dispense  Refill  . aspirin 81 MG tablet   Oral   Take 81 mg by mouth daily.         . potassium chloride SA (K-DUR,KLOR-CON) 20 MEQ tablet   Oral   Take 1 tablet (20 mEq total) by mouth daily.   30 tablet   5   . torsemide (DEMADEX) 20 MG tablet   Oral   Take 20 mg by mouth daily.          BP 175/108  Resp 20  Ht 5\' 6"  (1.676 m)  Wt 160 lb (72.576 kg)  BMI 25.84 kg/m2  SpO2 100% Physical Exam  Nursing note and vitals reviewed. Constitutional: She appears well-developed and well-nourished. She appears distressed.  HENT:  Head: Normocephalic and atraumatic.  Mouth/Throat: Mucous membranes are dry.  Eyes: Conjunctivae are normal. No scleral icterus. Right pupil is not reactive. Left pupil is not reactive.  Neck: Neck supple.  Cardiovascular: Normal heart sounds and intact distal pulses.  An irregularly irregular rhythm present. Tachycardia present.   No murmur heard. Pulmonary/Chest: Breath sounds normal. No stridor. She is  in respiratory distress (agonal respirations).  Abdominal: Soft. Bowel sounds are normal. She exhibits no distension. There is no tenderness.  Musculoskeletal: Normal range of motion.  Neurological: She is unresponsive. GCS eye subscore is 1. GCS verbal subscore is 1. GCS motor subscore is 1.  Skin: Skin is warm and dry. No rash noted.  Psychiatric:  Unable to test    ED Course  Procedures (including critical care time) Labs Review Labs Reviewed  CBC WITH DIFFERENTIAL - Abnormal; Notable for the following:    WBC 17.8 (*)    Neutrophils Relative % 79 (*)    Neutro Abs 14.1 (*)    Lymphocytes Relative 11 (*)    Monocytes Absolute 1.8 (*)    All other components within normal limits  COMPREHENSIVE METABOLIC PANEL - Abnormal;  Notable for the following:    Glucose, Bld 157 (*)    Albumin 3.1 (*)    AST 42 (*)    Total Bilirubin 0.2 (*)    GFR calc non Af Amer 79 (*)    All other components within normal limits  I-STAT CHEM 8, ED - Abnormal; Notable for the following:    BUN 31 (*)    Glucose, Bld 157 (*)    All other components within normal limits  LACTIC ACID, PLASMA  URINALYSIS, ROUTINE W REFLEX MICROSCOPIC   Imaging Review Dg Chest Port 1 View  June 17, 2013   CLINICAL DATA Unresponsive  EXAM PORTABLE CHEST - 1 VIEW  COMPARISON 04/18/2013  FINDINGS Interval development of perihilar edema bilaterally suggestive of fluid overload. There is underlying chronic lung disease. There is left lower lobe consolidation which could be atelectasis or pneumonia. No significant effusion.  IMPRESSION Interval development of perihilar edema.  Left lower lobe atelectasis/infiltrate.  SIGNATURE  Electronically Signed   By: Marlan Palau M.D.   On: 06-17-13 14:42  All radiology studies independently viewed by me.      EKG Interpretation None      MDM   Final diagnoses:  Unresponsive state  Death    80 year old female presenting unresponsive. Her daughter found her in this state shortly prior to arrival. Her daughter identified herself as the patient's healthcare power of attorney and reported that the patient would not want to be intubated or resuscitated.  On arrival, patient was unresponsive with GCS of 3. Initially required some bag valve mask ventilation, but this was discontinued after my conversation with patient's daughter. Basic lab tests and a chest x-ray was obtained which were unrevealing. Her daughter did not want any further testing, including head CT imaging. Ultimately, patient's agonal respirations worsened, she became hypoxic, and shortly thereafter became asystolic. Her family was at bedside.    I spoke with her primary care doctor's office and they will accept her death certificate for  completion.   Candyce Churn III, MD 06-17-2013 [phone]

## 2013-07-04 NOTE — ED Notes (Signed)
Family with chaplain at bedside.

## 2013-07-04 NOTE — ED Notes (Signed)
Time of dealth called by Dr Loretha StaplerWofford at [phone]; family at bedside

## 2013-07-04 NOTE — ED Notes (Addendum)
Pt found unresponsive at home on R side beside bed by daughter.  Pt LSN last night.  Pt unresponsive upon EMS arrival.  EMS reports blood on floor and blood suctioned out of mouth.  Pt arrives unresponsive and on a Non-rebreather mask.  EDP [place] at bedside.  Pt's daughter currently at bedside.

## 2013-07-04 NOTE — Progress Notes (Signed)
Chaplain responded to actively dying patient in ED. Pt was unresponsive with her daughter and granddaughter at bedside. Daughter Kristy Valencia shared that she had been the pt's primary caregiver for the last three years. She was tearful but stated that she "would not interfere with God's will," and intervene to try to extend pt's life. Daughter stated that the pt's comfort was her main concern and that she "loved her enough to let her go." Chaplain provided active listening, a caring presence, and prayer as daughter and granddaughter processed the pt's imminent death.

## 2013-07-04 DEATH — deceased

## 2016-01-09 IMAGING — CR DG CHEST 2V
2 series · 2 of 2 positions shown · non-contrast
Comparison: Two-view chest 08/21/2010.

CLINICAL DATA: Pain.  Frequent urination.

EXAM:
CHEST  2 VIEW

[w chest pa]
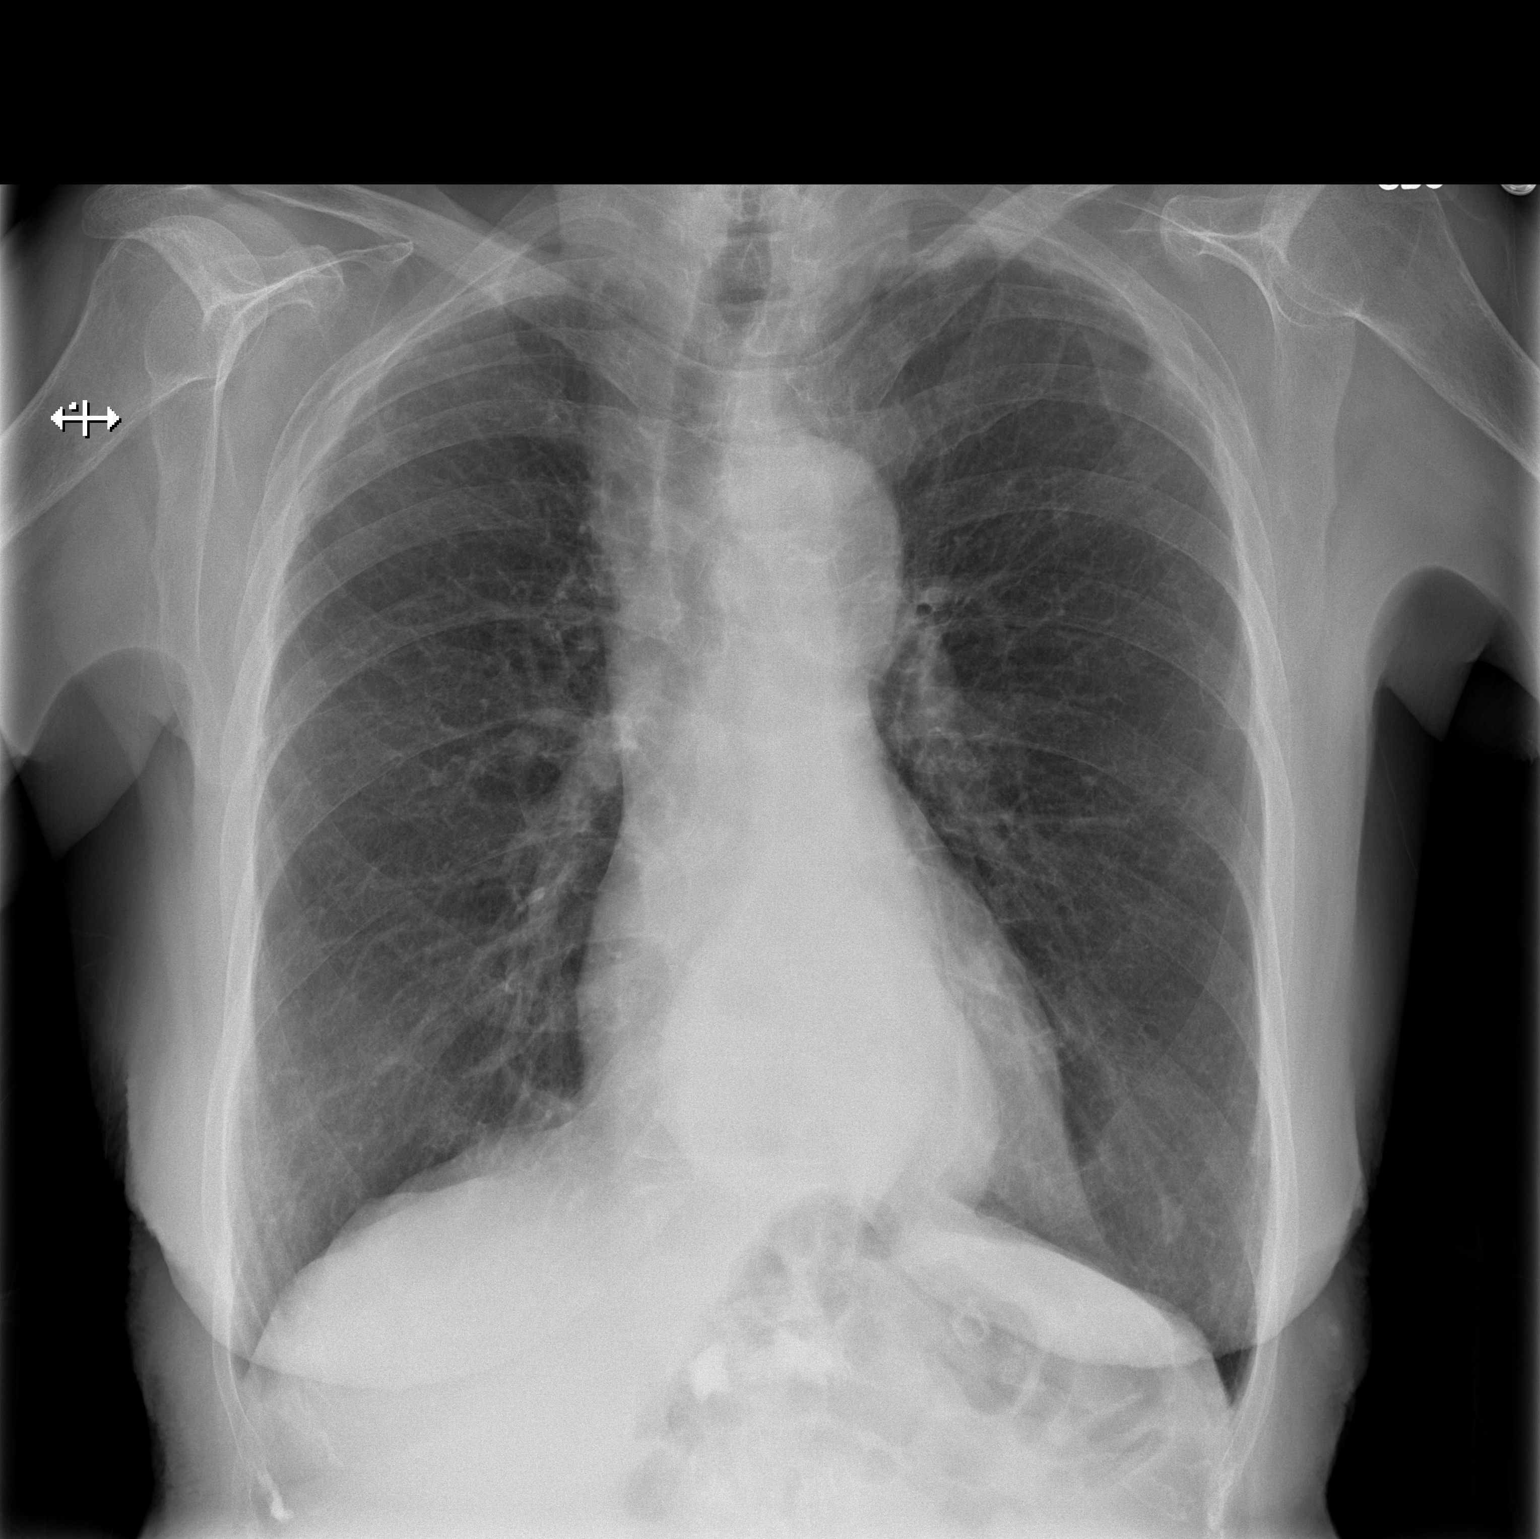

[w chest lat]
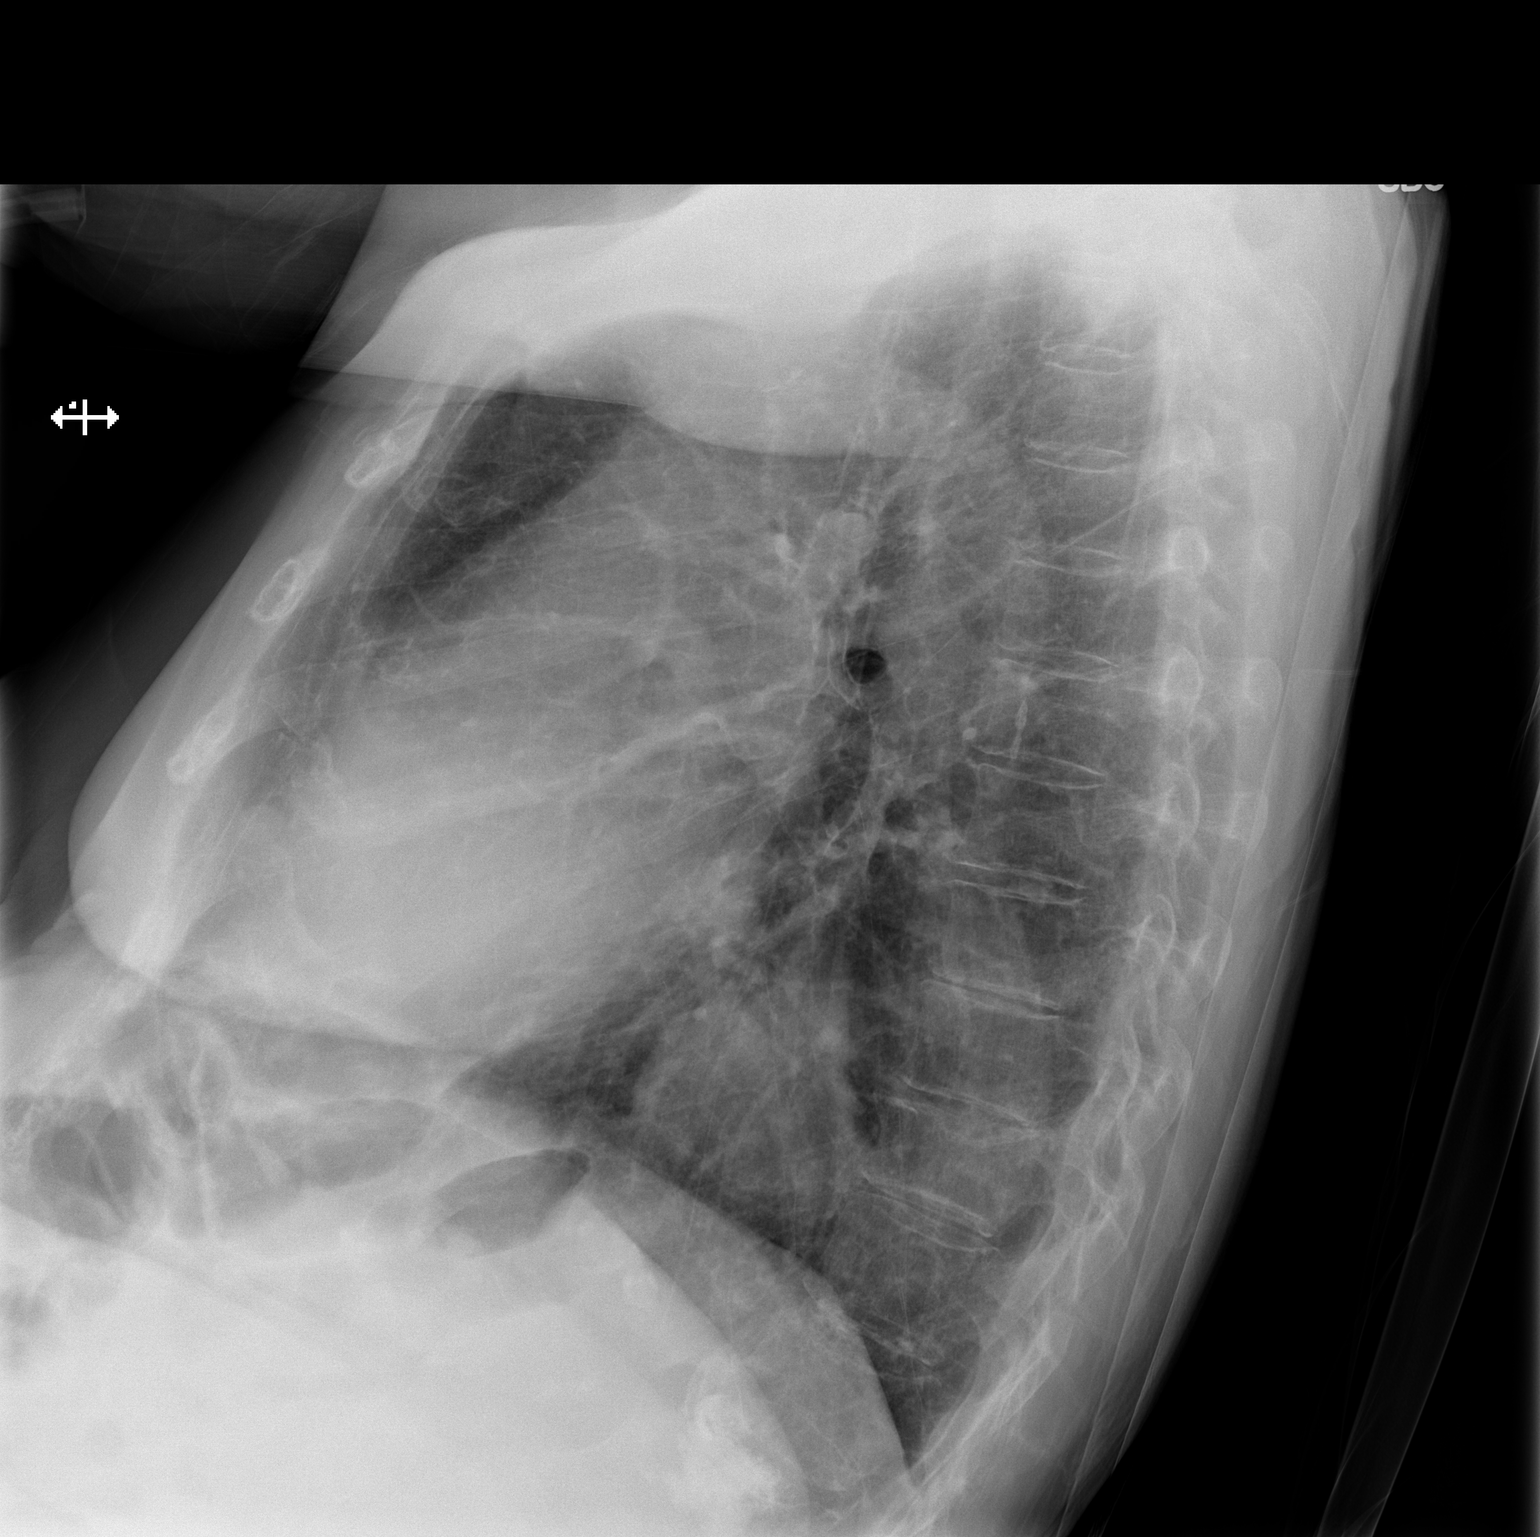

[2 of 2 positions shown; findings below may reference images not displayed]

FINDINGS: The heart size is normal. Biapical pleural parenchymal scarring is
noted. Emphysematous changes are stable. A moderate-sized hiatal
hernia is unchanged. Mild osteopenia is evident. Vertebral
augmentation at L1 is again noted. A superior endplate fracture at
T12 is new since 5558.
IMPRESSION: 1. No acute cardiopulmonary disease.
2. Moderate-sized hiatal hernia.
3. Emphysema.
4. Superior endplate T12 compression fracture is new since 5558. The
lesion is age indeterminate.

## 2016-01-09 IMAGING — CT CT ANGIO CHEST
2 of 8 series · 18 of 46 positions shown · IV contrast (APPLIED)
Comparison: None.

CLINICAL DATA: Pleuritic chest pain.  Elevated D-dimer.

EXAM:
CT ANGIOGRAPHY CHEST WITH CONTRAST
TECHNIQUE: Multidetector CT imaging of the chest was performed using the
standard protocol during bolus administration of intravenous
contrast. Multiplanar CT image reconstructions including MIPs were
obtained to evaluate the vascular anatomy.
CONTRAST:  80mL OMNIPAQUE IOHEXOL 350 MG/ML SOLN

[Series 5: thins · axial · 0.63mm/px · z∈[-289,-33]mm · 15 of 282 slices shown]
[im 13/282  lung]
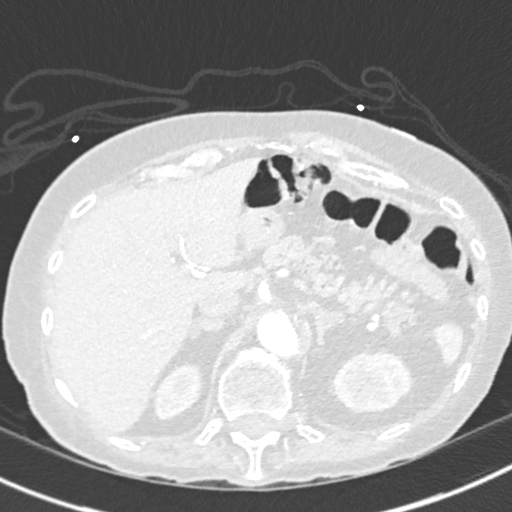
[im 39/282  soft-tissue]
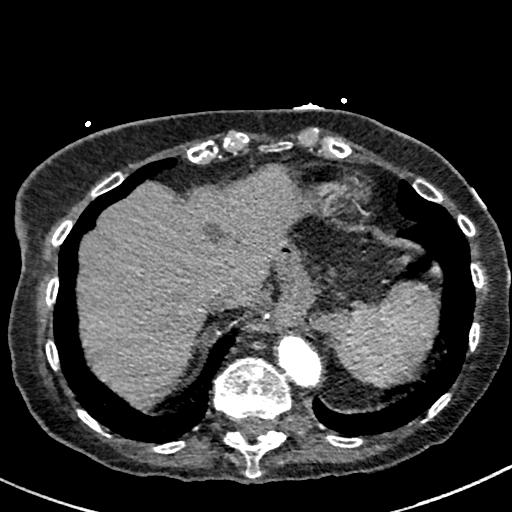
[im 52/282  lung]
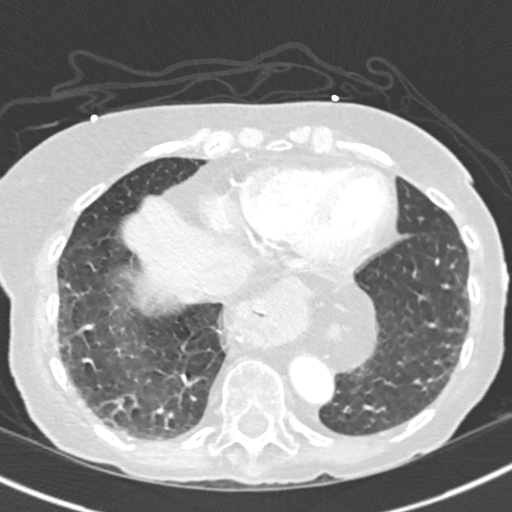
[im 64/282  soft-tissue]
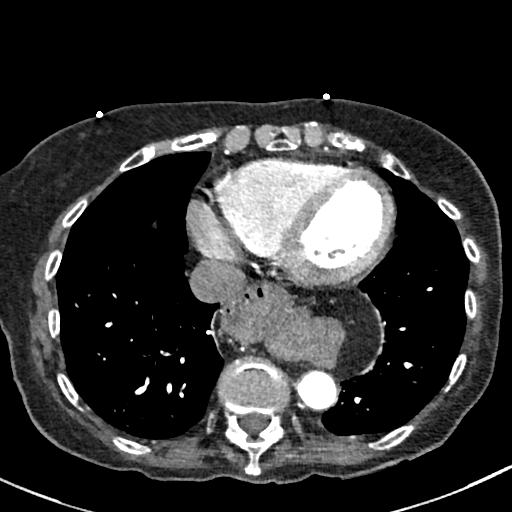
[im 90/282  lung]
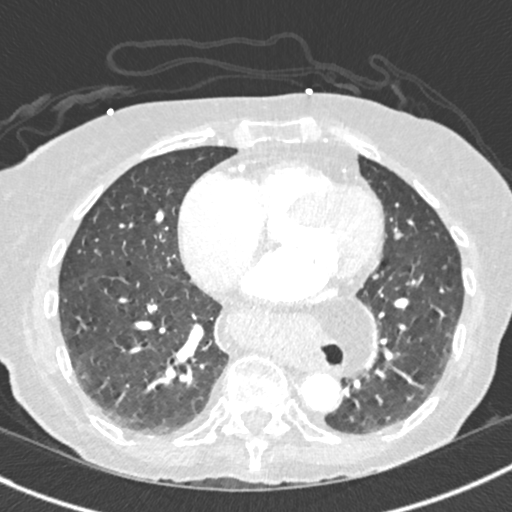
[im 103/282  soft-tissue]
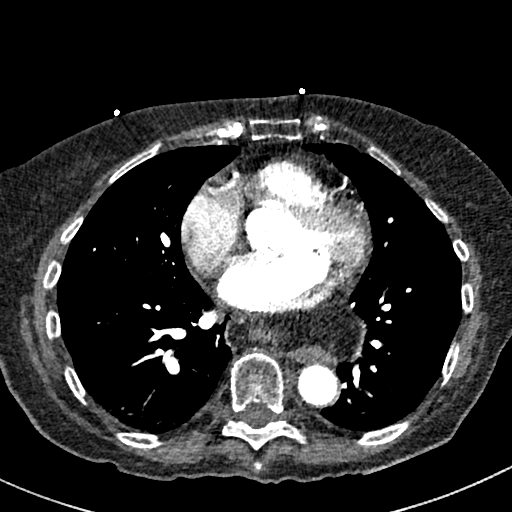
[im 128/282  lung]
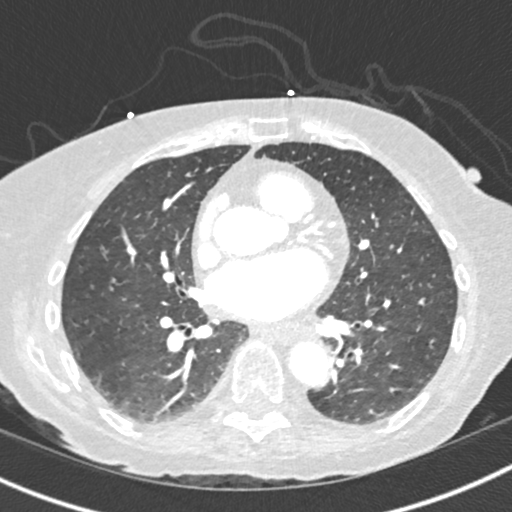
[im 141/282  soft-tissue]
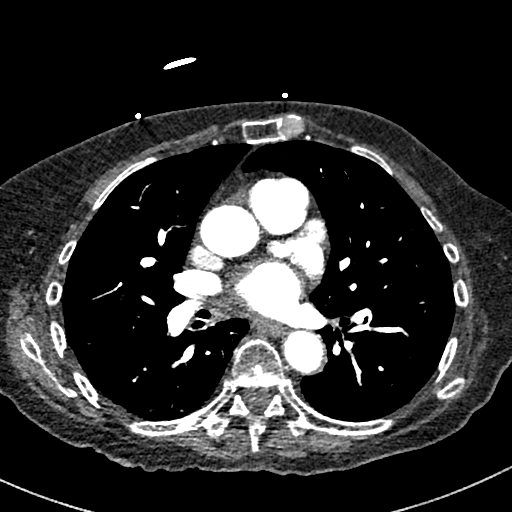
[im 154/282  lung]
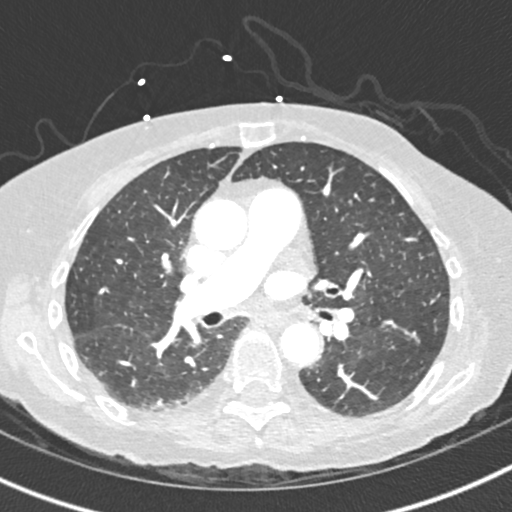
[im 179/282  soft-tissue]
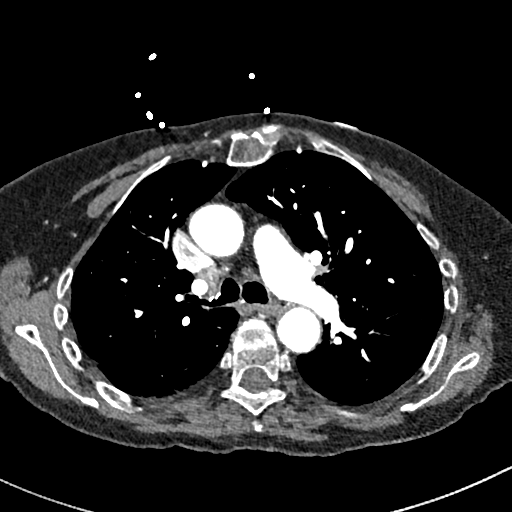
[im 192/282  lung]
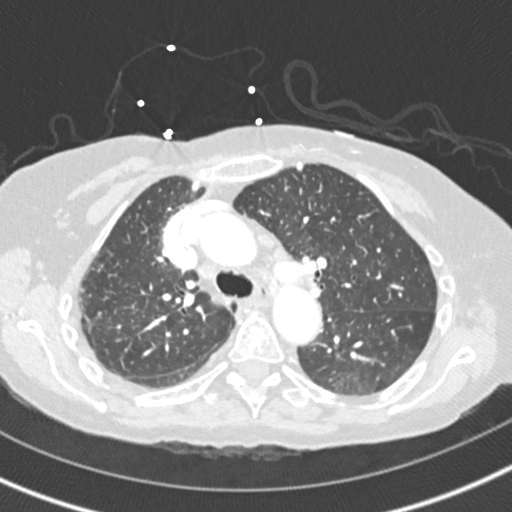
[im 218/282  soft-tissue]
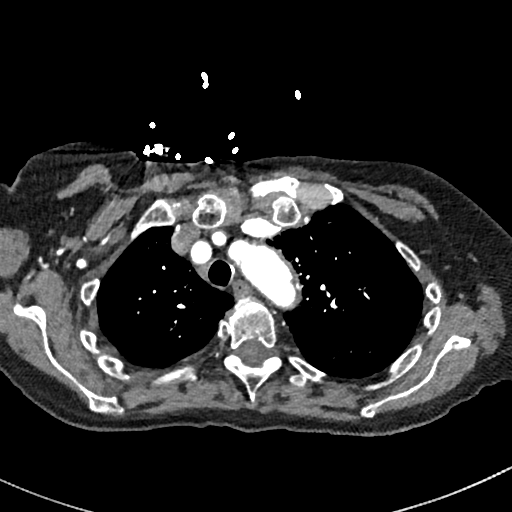
[im 230/282  lung]
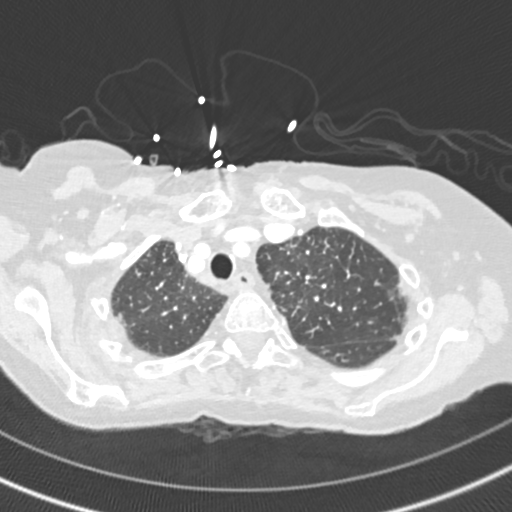
[im 243/282  soft-tissue]
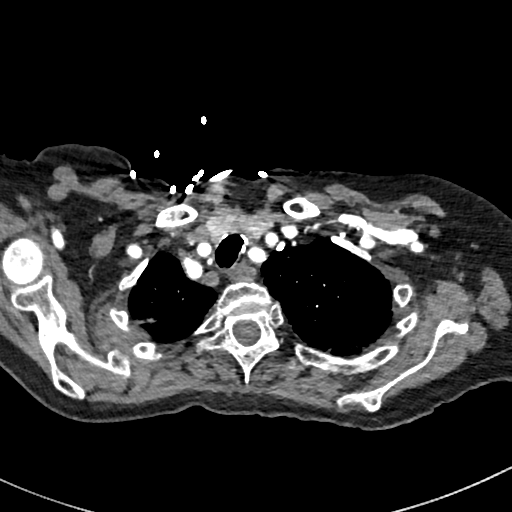
[im 269/282  lung]
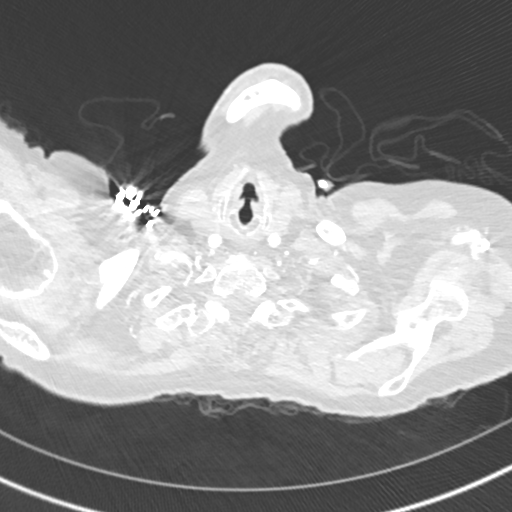

[Series 7: coronal mpr · coronal · 0.61mm/px · 3 of 119 slices shown]
[im 30/119  soft-tissue]
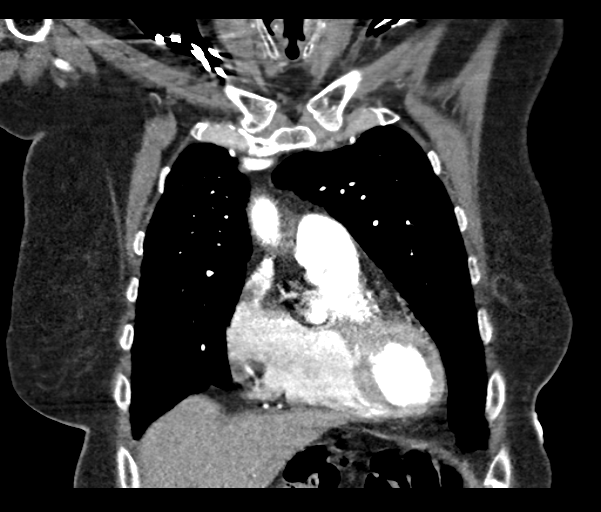
[im 60/119  soft-tissue]
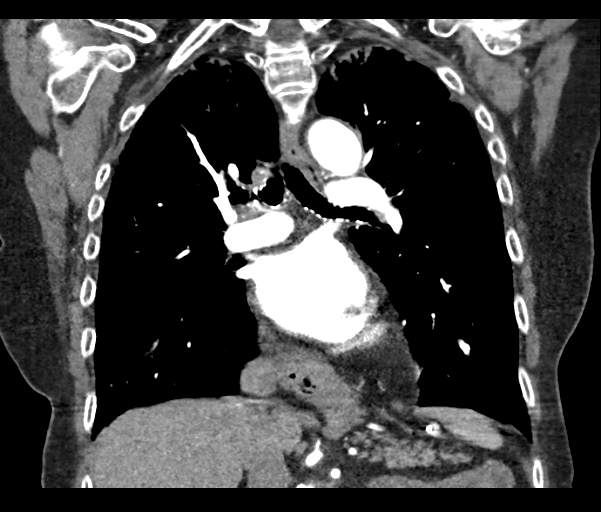
[im 89/119  soft-tissue]
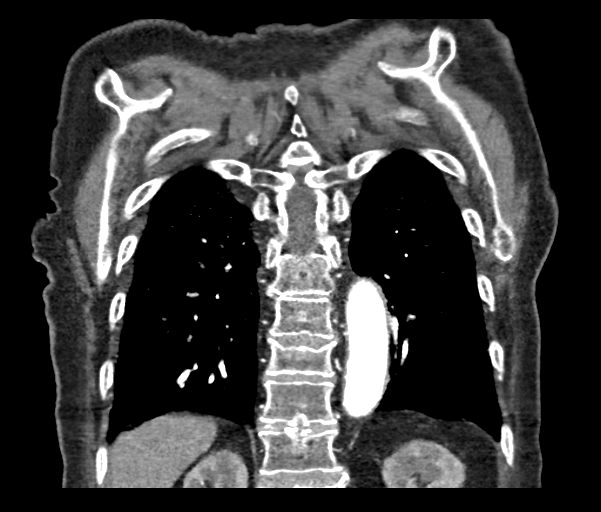

[18 of 46 positions shown; findings below may reference images not displayed]

FINDINGS: The chest wall is unremarkable. No breast masses, supraclavicular or
axillary mass or adenopathy. Small scattered lymph nodes are noted.
The thyroid gland is grossly normal. The bony thorax is intact. No
destructive bone lesions or spinal canal compromise. There is a mild
compression deformity of the T11 vertebral body of uncertain age.
This is likely remote given I do not see any paraspinal hematoma.
There is a dilated right-sided nerve root sheath on the right side.

The heart is normal in size for age. No pericardial effusion. There
are scattered mediastinal and hilar lymph nodes but no mass or overt
adenopathy. There is tortuosity, ectasia and calcification of the
thoracic aorta but no focal aneurysm or dissection. The esophagus
demonstrates a very large hiatal hernia.

The pulmonary arterial tree is well opacified. No filling defects to
suggest pulmonary emboli.

Examination of the lung parenchyma demonstrates apical scarring
changes and chronic bronchitic changes but no acute infiltrates,
effusion, edema or bronchiectasis. No pleural effusion. No worrisome
pulmonary nodules or masses.

The upper abdomen is unremarkable. A simple appearing hepatic cyst
is noted.

Review of the MIP images confirms the above findings.
IMPRESSION: No CT findings for pulmonary embolism.

No acute pulmonary findings.

Tortuosity and atherosclerotic calcifications involving the aorta
but no focal aneurysm or dissection.

Large hiatal hernia.
